# Patient Record
Sex: Male | Born: 1950
Health system: Southern US, Community
[De-identification: ages and names within clinical notes are randomized; demographics above are authoritative.]

## PROBLEM LIST (undated history)

## (undated) SURGERY — Surgical Case
Anesthesia: *Unknown

---
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
## 2016-10-22 DIAGNOSIS — J209 Acute bronchitis, unspecified: Secondary | ICD-10-CM | POA: Diagnosis not present

## 2016-10-22 DIAGNOSIS — J020 Streptococcal pharyngitis: Secondary | ICD-10-CM | POA: Diagnosis not present

## 2016-12-03 DIAGNOSIS — R69 Illness, unspecified: Secondary | ICD-10-CM | POA: Diagnosis not present

## 2017-06-08 DIAGNOSIS — N529 Male erectile dysfunction, unspecified: Secondary | ICD-10-CM | POA: Diagnosis not present

## 2017-06-08 DIAGNOSIS — E669 Obesity, unspecified: Secondary | ICD-10-CM | POA: Diagnosis not present

## 2017-06-08 DIAGNOSIS — R799 Abnormal finding of blood chemistry, unspecified: Secondary | ICD-10-CM | POA: Diagnosis not present

## 2017-06-08 DIAGNOSIS — R7309 Other abnormal glucose: Secondary | ICD-10-CM | POA: Diagnosis not present

## 2017-06-08 DIAGNOSIS — Z0001 Encounter for general adult medical examination with abnormal findings: Secondary | ICD-10-CM | POA: Diagnosis not present

## 2017-06-08 DIAGNOSIS — R69 Illness, unspecified: Secondary | ICD-10-CM | POA: Diagnosis not present

## 2017-07-25 DIAGNOSIS — R69 Illness, unspecified: Secondary | ICD-10-CM | POA: Diagnosis not present

## 2017-12-06 DIAGNOSIS — F339 Major depressive disorder, recurrent, unspecified: Secondary | ICD-10-CM | POA: Diagnosis not present

## 2017-12-06 DIAGNOSIS — R69 Illness, unspecified: Secondary | ICD-10-CM | POA: Diagnosis not present

## 2017-12-13 DIAGNOSIS — 419620001 Death: Secondary | SNOMED CT | POA: Diagnosis not present

## 2017-12-13 DEATH — deceased

## 2018-07-16 DIAGNOSIS — R69 Illness, unspecified: Secondary | ICD-10-CM | POA: Diagnosis not present

## 2018-11-23 DIAGNOSIS — J0140 Acute pansinusitis, unspecified: Secondary | ICD-10-CM | POA: Diagnosis not present

## 2018-12-01 DIAGNOSIS — R69 Illness, unspecified: Secondary | ICD-10-CM | POA: Diagnosis not present

## 2018-12-04 DIAGNOSIS — R69 Illness, unspecified: Secondary | ICD-10-CM | POA: Diagnosis not present

## 2019-03-04 ENCOUNTER — Telehealth: Payer: Self-pay

## 2019-03-04 ENCOUNTER — Other Ambulatory Visit: Payer: Self-pay

## 2019-03-04 NOTE — Telephone Encounter (Signed)
Copied from CRM [id number]. Topic: Appointment Scheduling - Scheduling Inquiry for Clinic >> Mar 04, 2019 12:28 PM Crist Infante wrote: Reason for CRM: pt calling to make a new pt appt with Lauren.  He said he stopped by the office and was told to call. No answer at the office.  Advised someone will call him back

## 2019-03-05 ENCOUNTER — Ambulatory Visit (INDEPENDENT_AMBULATORY_CARE_PROVIDER_SITE_OTHER): Payer: Medicare HMO | Admitting: Family Medicine

## 2019-03-05 ENCOUNTER — Encounter: Payer: Self-pay | Admitting: Family Medicine

## 2019-03-05 VITALS — BP 140/72 | HR 66 | Temp 98.0°F | Resp 16 | Ht 76.0 in | Wt 319.6 lb

## 2019-03-05 DIAGNOSIS — F419 Anxiety disorder, unspecified: Secondary | ICD-10-CM | POA: Diagnosis not present

## 2019-03-05 DIAGNOSIS — Z1159 Encounter for screening for other viral diseases: Secondary | ICD-10-CM

## 2019-03-05 DIAGNOSIS — Z125 Encounter for screening for malignant neoplasm of prostate: Secondary | ICD-10-CM

## 2019-03-05 DIAGNOSIS — N529 Male erectile dysfunction, unspecified: Secondary | ICD-10-CM

## 2019-03-05 DIAGNOSIS — R7989 Other specified abnormal findings of blood chemistry: Secondary | ICD-10-CM

## 2019-03-05 DIAGNOSIS — R69 Illness, unspecified: Secondary | ICD-10-CM | POA: Diagnosis not present

## 2019-03-05 LAB — COMPREHENSIVE METABOLIC PANEL
ALT: 11 U/L (ref 0–53)
AST: 10 U/L (ref 0–37)
Albumin: 4.1 g/dL (ref 3.5–5.2)
Alkaline Phosphatase: 61 U/L (ref 39–117)
BUN: 25 mg/dL — ABNORMAL HIGH (ref 6–23)
CO2: 30 mEq/L (ref 19–32)
Calcium: 9.2 mg/dL (ref 8.4–10.5)
Chloride: 100 mEq/L (ref 96–112)
Creatinine, Ser: 1.2 mg/dL (ref 0.40–1.50)
GFR: 59.98 mL/min — ABNORMAL LOW (ref >60.00)
Glucose, Bld: 98 mg/dL (ref 70–99)
Potassium: 5 mEq/L (ref 3.5–5.1)
Sodium: 138 mEq/L (ref 135–145)
Total Bilirubin: 0.7 mg/dL (ref 0.2–1.2)
Total Protein: 6.7 g/dL (ref 6.0–8.3)

## 2019-03-05 LAB — LIPID PANEL
Cholesterol: 182 mg/dL (ref 0–200)
HDL: 42.3 mg/dL (ref >39.00)
LDL Cholesterol: 118 mg/dL — ABNORMAL HIGH (ref 0–99)
NonHDL: 139.22
Total CHOL/HDL Ratio: 4
Triglycerides: 104 mg/dL (ref 0.0–149.0)
VLDL: 20.8 mg/dL (ref 0.0–40.0)

## 2019-03-05 LAB — TESTOSTERONE: Testosterone: 154.67 ng/dL — ABNORMAL LOW (ref 300.00–890.00)

## 2019-03-05 LAB — CBC WITH DIFFERENTIAL/PLATELET
Basophils Absolute: 0 10*3/uL (ref 0.0–0.1)
Basophils Relative: 0.6 % (ref 0.0–3.0)
Eosinophils Absolute: 0.1 10*3/uL (ref 0.0–0.7)
Eosinophils Relative: 1.5 % (ref 0.0–5.0)
HCT: 43 % (ref 39.0–52.0)
Hemoglobin: 14.6 g/dL (ref 13.0–17.0)
Lymphocytes Relative: 20.1 % (ref 12.0–46.0)
Lymphs Abs: 1.5 10*3/uL (ref 0.7–4.0)
MCHC: 33.9 g/dL (ref 30.0–36.0)
MCV: 86.2 fl (ref 78.0–100.0)
Monocytes Absolute: 0.7 10*3/uL (ref 0.1–1.0)
Monocytes Relative: 8.8 % (ref 3.0–12.0)
Neutro Abs: 5.1 10*3/uL (ref 1.4–7.7)
Neutrophils Relative %: 69 % (ref 43.0–77.0)
Platelets: 222 10*3/uL (ref 150.0–400.0)
RBC: 4.99 Mil/uL (ref 4.22–5.81)
RDW: 14.5 % (ref 11.5–15.5)
WBC: 7.4 10*3/uL (ref 4.0–10.5)

## 2019-03-05 LAB — TSH: TSH: 1.96 u[IU]/mL (ref 0.35–4.50)

## 2019-03-05 LAB — PSA, MEDICARE: PSA: 0.83 ng/ml (ref 0.10–4.00)

## 2019-03-05 MED ORDER — PAROXETINE HCL 20 MG PO TABS: 20.0000 mg | ORAL_TABLET | Freq: Every day | ORAL | 3 refills | Status: DC

## 2019-03-05 NOTE — Telephone Encounter (Signed)
Called pharmacy and they will be faxing over vaccine records today

## 2019-03-05 NOTE — Telephone Encounter (Signed)
Can you call [facility] pharmacy, the one in [place] near [place], for his vaccine records  [place] he has had 2 pneumonia vaccines, shingles vax there

## 2019-03-05 NOTE — Progress Notes (Signed)
Subjective:    Patient ID: Samuel FearsLawrence Lean, male    DOB: Apr 06, 1951, 68 y.o.   MRN: [id number]  HPI   Patient presents to clinic to establish with PCP.  Patient has not had a PCP in many years.  States last time he did have blood work was maybe 8 or 9 years ago.  Patient's main concerns is getting refill on his Paxil that he uses for anxiety and also having testosterone level rechecked due to it being low in the past and him having issues with sustaining an erection and having ejaculation. Has had issues with ED for at least 10 years. Was on Testerone replacement in the past, but did not keep up with it due to cost of medication at the time.   Patient has been using the urgent care in [place] off and on throughout the years for refills of his Paxil. Feels well on paxil, keeps anxiety in check. Denies SI or HI.   He has never had colonoscopy and does not want to have one.  He also declines Cologuard screening.  Patient is a Visual merchandiserfarmer.  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  He enjoys his work, states keeps him physically active.  Patient states he has had 2 pneumonia vaccines [facility] and is also had a shingles vaccine.  We will reach out to [facility] to get these records.  Patient Active Problem List   Diagnosis Date Noted  . Anxiety 03/05/2019  . Low testosterone in male 03/05/2019   History reviewed. No pertinent past medical history.  Past Surgical History:  Procedure Laterality Date  . HERNIA REPAIR  1953   Family History  Problem Relation Age of Onset  . Cancer Mother   . Hypertension Father    Review of Systems  Constitutional: Negative for chills, fatigue and fever.  HENT: Negative for congestion, ear pain, sinus pain and sore throat.   Eyes: Negative.   Respiratory: Negative for cough, shortness of breath and wheezing.   Cardiovascular: Negative for chest pain, palpitations and leg swelling.   Gastrointestinal: Negative for abdominal pain, diarrhea, nausea and vomiting.  Genitourinary: Negative for dysuria, frequency and urgency. +ED, hx of low T Musculoskeletal: Negative for arthralgias and myalgias.  Skin: Negative for color change, pallor and rash.  Neurological: Negative for syncope, light-headedness and headaches.  Psychiatric/Behavioral: The patient is not nervous/anxious.       Objective:   Physical Exam Vitals signs and nursing note reviewed.  Constitutional:      General: He is not in acute distress.    Appearance: He is not ill-appearing, toxic-appearing or diaphoretic.  HENT:     Head: Normocephalic and atraumatic.     Right Ear: Tympanic membrane, ear canal and external ear normal.     Left Ear: Tympanic membrane, ear canal and external ear normal.     Nose: Nose normal.     Mouth/Throat:     Mouth: Mucous membranes are moist.  Eyes:     General: No scleral icterus.    Extraocular Movements: Extraocular movements intact.     Pupils: Pupils are equal, round, and reactive to light.  Neck:     Musculoskeletal: Neck supple. No neck rigidity.  Cardiovascular:     Rate and Rhythm: Normal rate and regular rhythm.  Pulmonary:     Effort: Pulmonary effort is normal. No respiratory distress.     Breath sounds: Normal breath sounds.  Genitourinary:    Comments: Declines GU exam in office today Musculoskeletal:     Right lower leg: No edema.     Left lower leg: No edema.  Skin:    General: Skin is warm and dry.     Coloration: Skin is not pale.  Neurological:     General: No focal deficit present.     Mental Status: He is alert and oriented to person, place, and time.  Psychiatric:        Mood and Affect: Mood normal.        Behavior: Behavior normal.        Thought Content: Thought content normal.     Today's Vitals   03/05/19 1140  BP: 140/72  Pulse: 66  Resp: 16  Temp: 98 F (36.7 C)  SpO2: 96%  Weight: (!) 319 lb 9.6 oz (145 kg)  Height: 6'  4" (1.93 m)   Body mass index is 38.9 kg/m.     Assessment & Plan:   Anxiety- refill Paxil given.  Patient feels well on this medication at current dose.  Erectile dysfunction/low T in male-patient has a history of low testosterone which I suspect could be playing into his erectile dysfunction.  We will check testosterone level and lab work.  Patient is interested in testosterone replacement if his levels are low.  He has tried Viagra in the past for his ED without much success.  In lab work we will also include hep C screening, PSA, CBC, CMP, thyroid and cholesterol level.  Patient aware that he must get Tdap updated at the pharmacy due to the way Medicare bills this vaccine.  He will get this done.  Once we have blood work back we will determine next step in plan of care and timeframe of when at next follow-up as needed.  Patient aware he can call office anytime with questions or concerns.

## 2019-03-05 NOTE — Telephone Encounter (Signed)
Great - thanks

## 2019-03-06 LAB — HEPATITIS C ANTIBODY
Hepatitis C Ab: NONREACTIVE
SIGNAL TO CUT-OFF: 0.09 (ref <1.00)

## 2019-03-10 ENCOUNTER — Telehealth: Payer: Self-pay | Admitting: *Deleted

## 2019-03-10 ENCOUNTER — Encounter: Payer: Self-pay | Admitting: Lab

## 2019-03-10 DIAGNOSIS — R7989 Other specified abnormal findings of blood chemistry: Secondary | ICD-10-CM

## 2019-03-10 NOTE — Telephone Encounter (Signed)
Also -- please ask patient if he would like to start testosterone replacement.  We can send in topical gel, Androgel, and see if his insurance will cover  Thanks  LG

## 2019-03-10 NOTE — Telephone Encounter (Signed)
Called Pt spoke to him about hep C results and mailed a copy of results

## 2019-03-10 NOTE — Telephone Encounter (Signed)
Copied from CRM [id number]. Topic: General - Inquiry >> Mar 10, 2019  2:08 PM Deborha Payment wrote: Reason for CRM: Patient would like someone to call regarding his hepatics C test results.

## 2019-03-16 MED ORDER — TESTOSTERONE 25 MG/2.5GM (1%) TD GEL: 50.0000 mg | Freq: Every day | TRANSDERMAL | 1 refills | Status: DC

## 2019-03-16 NOTE — Telephone Encounter (Signed)
Called Pt and he stated he is willing to try the testosterone replacement. I don't have a phone number for his insurance I can see a picture of his Monia Pouch medicare card but there is no contact number in his chart.

## 2019-03-16 NOTE — Telephone Encounter (Signed)
Called Pt No answer left VM to call office. Will try back later.

## 2019-03-16 NOTE — Telephone Encounter (Signed)
Patient calling after hours to schedule appointment.

## 2019-03-16 NOTE — Telephone Encounter (Signed)
OK thanks  LG

## 2019-03-16 NOTE — Telephone Encounter (Signed)
I have sent in the androgel T replacement  Please have him schedule a 1 month follow up for recheck with me  Thanks  LG

## 2019-03-17 NOTE — Telephone Encounter (Signed)
Pt has appt scheduled for 04/15/2019

## 2019-04-15 ENCOUNTER — Ambulatory Visit (INDEPENDENT_AMBULATORY_CARE_PROVIDER_SITE_OTHER): Payer: Medicare HMO | Admitting: Family Medicine

## 2019-04-15 ENCOUNTER — Telehealth: Payer: Self-pay | Admitting: Family Medicine

## 2019-04-15 ENCOUNTER — Other Ambulatory Visit: Payer: Self-pay

## 2019-04-15 DIAGNOSIS — R69 Illness, unspecified: Secondary | ICD-10-CM | POA: Diagnosis not present

## 2019-04-15 DIAGNOSIS — N529 Male erectile dysfunction, unspecified: Secondary | ICD-10-CM | POA: Diagnosis not present

## 2019-04-15 DIAGNOSIS — F419 Anxiety disorder, unspecified: Secondary | ICD-10-CM

## 2019-04-15 DIAGNOSIS — R7989 Other specified abnormal findings of blood chemistry: Secondary | ICD-10-CM | POA: Diagnosis not present

## 2019-04-15 MED ORDER — SILDENAFIL CITRATE 100 MG PO TABS: 100.0000 mg | ORAL_TABLET | Freq: Every day | ORAL | 1 refills | Status: DC | PRN

## 2019-04-15 NOTE — Telephone Encounter (Signed)
OV Follow up for 10/18/2018 @ 8:00am

## 2019-04-15 NOTE — Progress Notes (Signed)
Patient ID: Samuel Gallegos, male   DOB: 09-02-1951, 68 y.o.   MRN: [id number]    Virtual Visit via phone Note  This visit type was conducted due to national recommendations for restrictions regarding the COVID-19 pandemic (e.g. social distancing).  This format is felt to be most appropriate for this patient at this time.  All issues noted in this document were discussed and addressed.  No physical exam was performed (except for noted visual exam findings with Video Visits).   I connected with Sharyn Lull today at  8:00 AM EDT by a video enabled elephone and verified that I am speaking with the correct person using two identifiers. Location patient: home Location provider: work or home office Persons participating in the virtual visit: patient, provider  I discussed the limitations, risks, security and privacy concerns of performing an evaluation and management service by telephone and the availability of in person appointments. I also discussed with the patient that there may be a patient responsible charge related to this service. The patient expressed understanding and agreed to proceed.  HPI:  Patient and I connected via telephone today to follow-up on Paxil and testosterone replacement.  Patient states his mood is great on the Paxil.  Does not feel too nervous or anxious and denies feeling down and depressed.  States the biggest reason he uses the Paxil is to help calm his nerves, usually will get nervous if having to interact with larger groups of people.  States the Paxil helps to keep his mood more calm and collected.  Denies any SI or HI.  Testosterone level was low in patient's most recent labs.  Per patient report this is been an ongoing issue for him.  He also has erectile dysfunction.  Patient had taken testosterone replacement approximately 5 years ago and this did help his erectile dysfunction to improve.  We restarted patient on testosterone replacement patient states he uses  testosterone topical gel for about 3 weeks, but noticed he was starting to gain weight.  States the small benefit he might of gotten from the testosterone placement did not outweigh the weight gain.  Patient decided to stop taking the testosterone daily.  Patient is interested in trying Viagra as needed for erectile dysfunction rather than doing testosterone replacement.  ROS:   Constitutional: Negative for chills, fatigue and fever.  HENT: Negative for congestion, ear pain, sinus pain and sore throat.   Eyes: Negative.   Respiratory: Negative for cough, shortness of breath and wheezing.   Cardiovascular: Negative for chest pain, palpitations and leg swelling.  Gastrointestinal: Negative for abdominal pain, diarrhea, nausea and vomiting.  Genitourinary: Negative for dysuria, frequency and urgency.  Musculoskeletal: Negative for arthralgias and myalgias.  Skin: Negative for color change, pallor and rash.  Neurological: Negative for syncope, light-headedness and headaches.  Psychiatric/Behavioral: The patient is not nervous/anxious.    Past Surgical History:  Procedure Laterality Date   HERNIA REPAIR  1953    Family History  Problem Relation Age of Onset   Cancer Mother    Hypertension Father     Social History   Tobacco Use   Smoking status: Never Smoker   Smokeless tobacco: Never Used  Substance Use Topics   Alcohol use: Never    Frequency: Never    Current Outpatient Medications:    PARoxetine (PAXIL) 20 MG tablet, Take 1 tablet (20 mg total) by mouth daily., Disp: 90 tablet, Rfl: 3   sildenafil (VIAGRA) 100 MG tablet, Take 1 tablet (100 mg  total) by mouth daily as needed for erectile dysfunction., Disp: 10 tablet, Rfl: 1  EXAM:  GENERAL: alert, oriented, sounds well and in no acute distress  LUNGS: Speaking in full sentences. no signs of respiratory distress, breathing rate appears normal, no obvious gross SOB, gasping, coughing or wheezing  PSYCH/NEURO:  pleasant and cooperative, no obvious depression or anxiety, speech and thought processing grossly intact  ASSESSMENT AND PLAN:  Discussed the following assessment and plan:  Anxiety - mood well controlled on Paxil.  He will continue this dose.  Low testosterone in male/erectile dysfunction - discussed with patient that any type of hormone replacement therapy can cause weight gain.  Patient does not want to gain weight actually he is trying to eat healthier to lose weight.  Patient has decided to no longer take testosterone replacement.  We will trial Viagra as needed for erectile dysfunction treatment.  Patient made aware that this medication may not be covered by his insurance and he most likely will have to pay out-of-pocket for the medication.  Patient verbalizes understanding of this.   I discussed the assessment and treatment plan with the patient. The patient was provided an opportunity to ask questions and all were answered. The patient agreed with the plan and demonstrated an understanding of the instructions.   The patient was advised to call back or seek an in-person evaluation if the symptoms worsen or if the condition fails to improve as anticipated.  I provided 15 minutes of non-face-to-face time over phone during this encounter.  We will plan for patient to follow-up in office in approximately 6 months.  We can get new blood work at that time.  He is aware he can return to clinic sooner if any issues arise.  Also made patient aware that flu vaccines will be available around September 2020  Tracey HarriesLauren M Zelig Gacek, FNP

## 2019-04-15 NOTE — Telephone Encounter (Signed)
Please call to set up 6 month follow up appt  Thanks  LG 

## 2019-06-24 ENCOUNTER — Ambulatory Visit (INDEPENDENT_AMBULATORY_CARE_PROVIDER_SITE_OTHER): Payer: Medicare HMO

## 2019-06-24 ENCOUNTER — Other Ambulatory Visit: Payer: Self-pay

## 2019-06-24 DIAGNOSIS — Z0000 Encounter for general adult medical examination without abnormal findings: Secondary | ICD-10-CM

## 2019-06-24 NOTE — Progress Notes (Signed)
Subjective:   Samuel Gallegos is a 68 y.o. male who presents for an Initial Medicare Annual Wellness Visit.  Review of Systems  No ROS.  Medicare Wellness Virtual Visit.  Visual/audio telehealth visit, UTA vital signs.   See social history for additional risk factors.   Cardiac Risk Factors include: advanced age (>16men, >42 women);male gender    Objective:    Today's Vitals   There is no height or weight on file to calculate BMI.  Advanced Directives 06/24/2019  Does Patient Have a Medical Advance Directive? No  Would patient like information on creating a medical advance directive? No - Patient declined    Current Medications (verified) Outpatient Encounter Medications as of 06/24/2019  Medication Sig  . PARoxetine (PAXIL) 20 MG tablet Take 1 tablet (20 mg total) by mouth daily.  . sildenafil (VIAGRA) 100 MG tablet Take 1 tablet (100 mg total) by mouth daily as needed for erectile dysfunction.   No facility-administered encounter medications on file as of 06/24/2019.     Allergies (verified) Amoxicillin   History: History reviewed. No pertinent past medical history. Past Surgical History:  Procedure Laterality Date  . HERNIA REPAIR  1953   Family History  Problem Relation Age of Onset  . Cancer Mother   . Hypertension Father    Social History   Socioeconomic History  . Marital status: Married    Spouse name: Not on file  . Number of children: Not on file  . Years of education: Not on file  . Highest education level: Not on file  Occupational History  . Not on file  Social Needs  . Financial resource strain: Not hard at all  . Food insecurity    Worry: Never true    Inability: Never true  . Transportation needs    Medical: No    Non-medical: No  Tobacco Use  . Smoking status: Never Smoker  . Smokeless tobacco: Never Used  Substance and Sexual Activity  . Alcohol use: Never    Frequency: Never  . Drug use: Never  . Sexual activity: Not on file   Lifestyle  . Physical activity    Days per week: Not on file    Minutes per session: Not on file  . Stress: Not at all  Relationships  . Social Musician on phone: Not on file    Gets together: Not on file    Attends religious service: Not on file    Active member of club or organization: Not on file    Attends meetings of clubs or organizations: Not on file    Relationship status: Not on file  Other Topics Concern  . Not on file  Social History Narrative  . Not on file   Tobacco Counseling Counseling given: Not Answered   Clinical Intake:  Pre-visit preparation completed: Yes        Diabetes: No  How often do you need to have someone help you when you read instructions, pamphlets, or other written materials from your doctor or pharmacy?: 1 - Never  Interpreter Needed?: No     Activities of Daily Living In your present state of health, do you have any difficulty performing the following activities: 06/24/2019  Hearing? N  Vision? N  Difficulty concentrating or making decisions? N  Comment Admits difficulty remembering names; age appropriate  Walking or climbing stairs? Y  Comment He paces himself  Dressing or bathing? N  Doing errands, shopping? N  Preparing Food  and eating ? N  Using the Toilet? N  In the past six months, have you accidently leaked urine? N  Do you have problems with loss of bowel control? N  Managing your Medications? N  Managing your Finances? N  Housekeeping or managing your Housekeeping? N  Some recent data might be hidden     Immunizations and Health Maintenance Immunization History  Administered Date(s) Administered  . Influenza Inj Mdck Quad Pf 07/16/2018  . Pneumococcal Conjugate-13 08/06/2015  . Pneumococcal-Unspecified 07/18/2016  . Zoster 08/01/2013   Health Maintenance Due  Topic Date Due  . Samul Dada  11/29/1969    Patient Care Team: Jodelle Green, FNP as PCP - General (Family Medicine)  Indicate  any recent Medical Services you may have received from other than [facility] providers in the past year (date may be approximate).    Assessment:   This is a routine wellness examination for [facility].  I connected with patient 06/24/19 at 10:00 AM EDT by an audio enabled telemedicine application and verified that I am speaking with the correct person using two identifiers. Patient stated full name and DOB. Patient gave permission to continue with virtual visit. Patient's location was at home and Nurse's location was at [place] office.   Health Maintenance Due: Influenza vaccine 2020- discussed; to be completed in season with doctor or local pharmacy.   Tdap- discussed; to be completed with doctor in visit or local pharmacy. Update all pending maintenance due as appropriate.   See completed HM at the end of note.   Eye: Visual acuity not assessed. Virtual visit. Wears reading glasses.    Dental: Visits every 12 months.    Hearing: Demonstrates normal hearing during visit.  Safety:  Patient feels safe at home- yes Patient does have smoke detectors at home- yes Patient does wear sunscreen or protective clothing when in direct sunlight - yes Patient does wear seat belt when in a moving vehicle - yes Patient drives- yes Adequate lighting in walkways free from debris- yes Grab bars and handrails used as appropriate- yes Ambulates with no assistive device Cell phone on person when ambulating outside of the home- yes  Social: Alcohol intake - no    Smoking history- never   Smokers in home? none Illicit drug use? none  Depression: PHQ 2 &9 complete. See screening below. Denies irritability, anhedonia, sadness/tearfullness.  Stable. Taking medication as directed.   Falls: See screening below.    Medication: Taking as directed and without issues.   Covid-19: Precautions and sickness symptoms discussed. Wears mask, social distancing, hand hygiene as appropriate.   Activities of Daily  Living Patient denies needing assistance with: household chores, feeding themselves, getting from bed to chair, getting to the toilet, bathing/showering, dressing, managing money, or preparing meals.   Memory: Patient is alert. Patient denies difficulty focusing or concentrating. Correctly identified the president of the [place], season and recall. 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  BMI- discussed the importance of a healthy diet, water intake and the benefits of aerobic exercise.  Educational material provided.  Physical activity- farms for a living  Diet:  Regular Water: good intake  Advanced Directive: End of life planning; Advanced aging; Advanced directives discussed.  No HCPOA/Living Will.  Additional information declined at this time.  Other Providers Patient Care Team: Jodelle Green, FNP as PCP - General (Family Medicine)  Hearing/Vision screen  Hearing Screening   125Hz  250Hz  500Hz  1000Hz  2000Hz  3000Hz  4000Hz  6000Hz  8000Hz   Right ear:  Left ear:           Comments: Patient is able to hear conversational tones without difficulty.  No issues reported.  Vision Screening Comments: Visual acuity not assessed, virtual visit.       Dietary issues and exercise activities discussed: Current Exercise Habits: Home exercise routine, Intensity: Mild  Goals      Patient Stated   . Weight (lb) < 319 lb (144.7 kg) (pt-stated)      Depression Screen PHQ 2/9 Scores 06/24/2019 04/15/2019 03/05/2019  PHQ - 2 Score 0 0 0  PHQ- 9 Score - 0 0    Fall Risk Fall Risk  06/24/2019 03/05/2019  Falls in the past year? 0 0  Number falls in past yr: - 0  Injury with Fall? - 0  Follow up - Falls evaluation completed   Timed Get Up and Go performed: no, virtual visit  Cognitive Function:     6CIT Screen 06/24/2019  What Year? 0 points  What month? 0 points  What time? 0 points  Count back from 20 0 points  Months in reverse 0 points  Repeat phrase 0  points  Total Score 0    Screening Tests Health Maintenance  Topic Date Due  . TETANUS/TDAP  11/29/1969  . INFLUENZA VACCINE  01/13/2020 (Originally 05/16/2019)  . COLONOSCOPY  03/04/2020 (Originally 11/30/1999)  . Hepatitis C Screening  Completed  . PNA vac Low Risk Adult  Completed      Plan:   Keep all routine maintenance appointments.   Follow up 10/19/19   Medicare Attestation I have personally reviewed: The patient's medical and social history Their use of alcohol, tobacco or illicit drugs Their current medications and supplements The patient's functional ability including ADLs,fall risks, home safety risks, cognitive, and hearing and visual impairment Diet and physical activities Evidence for depression  Ashok PallOBrien-Blaney, Shronda Boeh L, LPN   1/6/10969/06/2019

## 2019-06-24 NOTE — Patient Instructions (Addendum)
  Mr. Samuel Gallegos , Thank you for taking time to come for your Medicare Wellness Visit. I appreciate your ongoing commitment to your health goals. Please review the following plan we discussed and let me know if I can assist you in the future.   These are the goals we discussed: Goals      Patient Stated   . Weight (lb) < 319 lb (144.7 kg) (pt-stated)       This is a list of the screening recommended for you and due dates:  Health Maintenance  Topic Date Due  . Tetanus Vaccine  11/29/1968  . Flu Shot  01/13/2020*  . Colon Cancer Screening  03/04/2020*  .  Hepatitis C: One time screening is recommended by Center for Disease Control  (CDC) for  adults born from [birth year] through 1966.   Completed  . Pneumonia vaccines  Completed  *Topic was postponed. The date shown is not the original due date.

## 2019-07-27 DIAGNOSIS — R69 Illness, unspecified: Secondary | ICD-10-CM | POA: Diagnosis not present

## 2019-09-06 ENCOUNTER — Other Ambulatory Visit: Payer: Self-pay | Admitting: Family Medicine

## 2019-09-06 DIAGNOSIS — R7989 Other specified abnormal findings of blood chemistry: Secondary | ICD-10-CM

## 2019-10-19 ENCOUNTER — Ambulatory Visit: Payer: Medicare HMO | Admitting: Family Medicine

## 2019-11-26 ENCOUNTER — Ambulatory Visit: Payer: Medicare HMO | Attending: Internal Medicine

## 2019-11-26 ENCOUNTER — Other Ambulatory Visit: Payer: Self-pay

## 2019-11-26 DIAGNOSIS — Z23 Encounter for immunization: Secondary | ICD-10-CM

## 2019-11-26 NOTE — Progress Notes (Signed)
   Covid-19 Vaccination Clinic  Name:  Samuel Gallegos    MRN: [id number] DOB: [birth year]  11/26/2019  [name] was observed post Covid-19 immunization for 15 minutes without incidence. He was provided with Vaccine Information Sheet and instruction to access the V-Safe system.   [name] was instructed to call 911 with any severe reactions post vaccine: Marland Kitchen Difficulty breathing  . Swelling of your face and throat  . A fast heartbeat  . A bad rash all over your body  . Dizziness and weakness    Immunizations Administered    Name Date Dose VIS Date Route   Pfizer COVID-19 Vaccine 11/26/2019 10:02 AM 0.3 mL 09/25/2019 Intramuscular   Manufacturer: [employer], [employer]   Lot: [id number]   NDC: [id number]

## 2019-12-04 ENCOUNTER — Encounter: Payer: Self-pay | Admitting: Family

## 2019-12-04 ENCOUNTER — Ambulatory Visit (INDEPENDENT_AMBULATORY_CARE_PROVIDER_SITE_OTHER): Payer: Medicare HMO | Admitting: Family

## 2019-12-04 DIAGNOSIS — R7989 Other specified abnormal findings of blood chemistry: Secondary | ICD-10-CM

## 2019-12-04 DIAGNOSIS — F419 Anxiety disorder, unspecified: Secondary | ICD-10-CM

## 2019-12-04 DIAGNOSIS — R69 Illness, unspecified: Secondary | ICD-10-CM | POA: Diagnosis not present

## 2019-12-04 MED ORDER — PAROXETINE HCL 20 MG PO TABS: 20.0000 mg | ORAL_TABLET | Freq: Every day | ORAL | 3 refills | Status: DC

## 2019-12-04 NOTE — Assessment & Plan Note (Addendum)
Stable. Education provided on viagra. Advised to tell EMS /other physician that he is on this medication. He verbalized understanding. Advised urology consult due to h/o low testosterone, he declines at this time.

## 2019-12-04 NOTE — Assessment & Plan Note (Signed)
Stable °Continue paxil °

## 2019-12-04 NOTE — Progress Notes (Signed)
Virtual Visit via Video Note  I connected with@  on 12/04/19 at  1:30 PM EST by a video enabled telemedicine application and verified that I am speaking with the correct person using two identifiers.  Location patient: home Location provider:home office Persons participating in the virtual visit: patient, provider  I discussed the limitations of evaluation and management by telemedicine and the availability of in person appointments. The patient expressed understanding and agreed to proceed.  Interactive audio and video telecommunications were attempted between this provider and patient, however failed, due to patient having technical difficulties or patient did not have access to video capability.  We continued and completed visit with audio only.   HPI: Feels well, no complaints today.  Establish care  States during [employer] CPE he is under the limit of blood pressure, always under 140/90 although doesn't know exact number. No CP, sob.   Anxiety- feels well on paxil. This controls his anxiety.  No si/hi.  On viagra prn. No h/o heart attack. No use of nitrates.   Due colonoscopy; declines colonoscopy and ColoGuard.    ROS: See pertinent positives and negatives per HPI.  History reviewed. No pertinent past medical history.  Past Surgical History:  Procedure Laterality Date  . HERNIA REPAIR  1953    Family History  Problem Relation Age of Onset  . Cancer Mother   . Hypertension Father     SOCIAL HX: never smoker   Current Outpatient Medications:  .  PARoxetine (PAXIL) 20 MG tablet, Take 1 tablet (20 mg total) by mouth daily., Disp: 90 tablet, Rfl: 3 .  sildenafil (VIAGRA) 100 MG tablet, Take 1 tablet (100 mg total) by mouth daily as needed for erectile dysfunction., Disp: 10 tablet, Rfl: 1  EXAM:  VITALS per patient if applicable: BP Readings from Last 3 Encounters:  03/05/19 140/72    GENERAL: alert, oriented, appears well and in no acute distress   ASSESSMENT AND  PLAN:  Discussed the following assessment and plan:  Low testosterone in male  Anxiety - Plan: PARoxetine (PAXIL) 20 MG tablet Problem List Items Addressed This Visit      Other   Anxiety    Stable. Continue paxil.       Relevant Medications   PARoxetine (PAXIL) 20 MG tablet   Low testosterone in male    Stable. Education provided on viagra. Advised to tell EMS /other physician that he is on this medication. He verbalized understanding. Advised urology consult due to h/o low testosterone, he declines at this time.        Of note: declines colonoscopy and ColoGuard.   -we discussed possible serious and likely etiologies, options for evaluation and workup, limitations of telemedicine visit vs in person visit, treatment, treatment risks and precautions. Pt prefers to treat via telemedicine empirically rather then risking or undertaking an in person visit at this moment. Patient agrees to seek prompt in person care if worsening, new symptoms arise, or if is not improving with treatment.   I discussed the assessment and treatment plan with the patient. The patient was provided an opportunity to ask questions and all were answered. The patient agreed with the plan and demonstrated an understanding of the instructions.   The patient was advised to call back or seek an in-person evaluation if the symptoms worsen or if the condition fails to improve as anticipated.   Rennie Plowman, FNP

## 2019-12-07 DIAGNOSIS — R69 Illness, unspecified: Secondary | ICD-10-CM | POA: Diagnosis not present

## 2019-12-09 DIAGNOSIS — R69 Illness, unspecified: Secondary | ICD-10-CM | POA: Diagnosis not present

## 2019-12-22 ENCOUNTER — Telehealth: Payer: Self-pay | Admitting: Family

## 2019-12-22 DIAGNOSIS — G4700 Insomnia, unspecified: Secondary | ICD-10-CM

## 2019-12-22 MED ORDER — TRAZODONE HCL 50 MG PO TABS: 25.0000 mg | ORAL_TABLET | Freq: Every evening | ORAL | 3 refills | Status: DC | PRN

## 2019-12-22 NOTE — Telephone Encounter (Signed)
Pt called and said that at his last appt he talked to [facility] about not sleeping at night and would like something called in to help him sleep

## 2019-12-22 NOTE — Telephone Encounter (Signed)
I called & directed patient on how to best take trazodone. Patient verbalized understanding & has f/u in 8 weeks.

## 2019-12-22 NOTE — Telephone Encounter (Signed)
Call patient Samuel Gallegos may trial low-dose trazodone.  This medication Samuel Gallegos may take as needed however it is more effective taken every single night.  make follow-up in 8 weeks to see if this medication is effective.  Please give me education below as well  Essentials for good sleep:   #1 Exercise #2 Limit Caffeine ( no caffeine after lunch) #3 No smart phones, TV prior to bed -- BLUE light is VERY activating and send the brain an 'awake message.'  #4 Go to bed at same time of night each night and get up at same time of day.  #5 Take 0.5 to 5mg  melatonin at 7pm with dinner -this is when natural melatonin will start to increase

## 2019-12-22 NOTE — Addendum Note (Signed)
Addended by: Allegra Grana on: 12/22/2019 12:21 PM   Modules accepted: Orders

## 2019-12-22 NOTE — Telephone Encounter (Signed)
Not sure what all was dicussed at this appointment? Patient would like something to help him sleep.

## 2019-12-23 ENCOUNTER — Ambulatory Visit: Payer: Medicare HMO | Attending: Internal Medicine

## 2019-12-23 DIAGNOSIS — Z23 Encounter for immunization: Secondary | ICD-10-CM | POA: Insufficient documentation

## 2019-12-23 NOTE — Progress Notes (Signed)
   Covid-19 Vaccination Clinic  Name:  Samuel Gallegos    MRN: [id number] DOB: 27-Aug-1951  12/23/2019  Mr. Samuel Gallegos was observed post Covid-19 immunization for 15 minutes without incident. He was provided with Vaccine Information Sheet and instruction to access the V-Safe system.   Mr. Samuel Gallegos was instructed to call 911 with any severe reactions post vaccine: Marland Kitchen Difficulty breathing  . Swelling of face and throat  . A fast heartbeat  . A bad rash all over body  . Dizziness and weakness   Immunizations Administered    Name Date Dose VIS Date Route   Pfizer COVID-19 Vaccine 12/23/2019 10:52 AM 0.3 mL 09/25/2019 Intramuscular   Manufacturer: [employer], [employer]   Lot: [id number]   NDC: [address]-4

## 2020-01-13 ENCOUNTER — Other Ambulatory Visit: Payer: Self-pay | Admitting: Family

## 2020-01-13 DIAGNOSIS — G4700 Insomnia, unspecified: Secondary | ICD-10-CM

## 2020-01-18 ENCOUNTER — Encounter: Payer: Self-pay | Admitting: Family Medicine

## 2020-01-18 ENCOUNTER — Telehealth: Payer: Self-pay | Admitting: Family

## 2020-01-18 ENCOUNTER — Ambulatory Visit (INDEPENDENT_AMBULATORY_CARE_PROVIDER_SITE_OTHER)
Admission: RE | Admit: 2020-01-18 | Discharge: 2020-01-18 | Disposition: A | Discharge status: Home / Self Care | Payer: Medicare HMO | Source: Ambulatory Visit | Attending: Family Medicine | Admitting: Family Medicine

## 2020-01-18 ENCOUNTER — Ambulatory Visit (INDEPENDENT_AMBULATORY_CARE_PROVIDER_SITE_OTHER): Payer: Medicare HMO | Admitting: Family Medicine

## 2020-01-18 ENCOUNTER — Other Ambulatory Visit: Payer: Self-pay

## 2020-01-18 VITALS — BP 146/62 | HR 80 | Temp 96.7°F | Ht 76.0 in | Wt 336.2 lb

## 2020-01-18 DIAGNOSIS — M79671 Pain in right foot: Secondary | ICD-10-CM

## 2020-01-18 MED ORDER — COLCHICINE 0.6 MG PO TABS: 0.6000 mg | ORAL_TABLET | Freq: Every day | ORAL | 0 refills | Status: DC | PRN

## 2020-01-18 NOTE — Telephone Encounter (Signed)
FYI

## 2020-01-18 NOTE — Progress Notes (Signed)
This visit occurred during the SARS-CoV-2 public health emergency.  Safety protocols were in place, including screening questions prior to the visit, additional usage of staff PPE, and extensive cleaning of exam room while observing appropriate contact time as indicated for disinfecting solutions.  R foot pain.  Started about 2 weeks ago.  Pain at distal 5th MT/MTP and along 5th ray.  No pain with first step in the AM.  Pain is worse only after he has been up and walking for about 1 hour.  Pain with sheet laying on foot.  H/o gout in distant past per patient report.  No R 1st MTP pain.  Aleve helps.  No trauma.  This felt similar to prev gout pain.   No L foot pain.    Meds, vitals, and allergies reviewed.   ROS: Per HPI unless specifically indicated in ROS section   nad ncat Able to bear weight with discomfort No R foot edema and DP pulse intact but ttp along 5th ray and 5th MTP w/o bruising.  NV intact o/w.

## 2020-01-18 NOTE — Patient Instructions (Signed)
Colchicine with food daily as needed.  Take up to 600mg  ibuprofen with food up to 3 times a day.  Taper ibuprofen as pain allows.  Update as needed.  Take care.  Glad to see you.

## 2020-01-18 NOTE — Telephone Encounter (Signed)
Pt called in and wanted an appt with Claris Che. He said his foot was hurting underneath the bottom. He thinks it is plantar fascitis. He said at night it goes away and in the morning it is worse. I was able to get him an appt with Dr. Para March at [facility] at 3:30pm today.

## 2020-01-19 NOTE — Telephone Encounter (Signed)
Seen by dr Para March

## 2020-01-20 DIAGNOSIS — M79671 Pain in right foot: Secondary | ICD-10-CM | POA: Insufficient documentation

## 2020-01-20 NOTE — Assessment & Plan Note (Signed)
Xray neg.  D/w pt about options.   Presumed gout.  Can take colchicine with food daily as needed.  Take up to 600mg  ibuprofen with food up to 3 times a day.  Taper ibuprofen as pain allows.  Update as needed.  He agrees.

## 2020-01-30 ENCOUNTER — Other Ambulatory Visit: Payer: Self-pay | Admitting: Family Medicine

## 2020-02-01 NOTE — Telephone Encounter (Signed)
Call pt I refilled colchicine however would like him to make f/u appt with me so we can discuss staying on this medication daily for presumed gout as preventative or just using for acute flares/  We will also need to check is uric acid when he comes in for the appt.

## 2020-02-12 NOTE — Telephone Encounter (Signed)
Patient has follow-up 5/11.

## 2020-02-17 ENCOUNTER — Telehealth: Payer: Self-pay

## 2020-02-17 NOTE — Telephone Encounter (Signed)
Patient called and was requesting to be prescribed something for leg swelling. Leg/ankle swelling is bilateral & just has been worse since it has been warmer weather, He stated that when he takes his socks off there is a definite line around his ankles. He said this has happened in thr past just slightly worse this time. No other symptoms & patient feels good. I wanted to make sure no signs or SX of blood clot. I felt better he had dealt with in the past & that it was in both legs/ankles & some on top of his feet. He also has been eating a lot of salty peanuts, which he stated that he was going to stop. For anything to be prescribed I let him know that he needed to be seen. Margaret isn't here, so I made patient appointment with you at 9:30 to be seen.

## 2020-02-18 ENCOUNTER — Ambulatory Visit: Payer: Medicare HMO

## 2020-02-18 ENCOUNTER — Ambulatory Visit: Payer: Medicare HMO | Admitting: Nurse Practitioner

## 2020-02-18 ENCOUNTER — Other Ambulatory Visit: Payer: Self-pay

## 2020-02-18 ENCOUNTER — Encounter: Payer: Self-pay | Admitting: Nurse Practitioner

## 2020-02-18 VITALS — BP 130/80 | HR 75 | Temp 96.9°F | Ht 76.0 in | Wt 333.2 lb

## 2020-02-18 DIAGNOSIS — R600 Localized edema: Secondary | ICD-10-CM

## 2020-02-18 DIAGNOSIS — R011 Cardiac murmur, unspecified: Secondary | ICD-10-CM

## 2020-02-18 DIAGNOSIS — R9431 Abnormal electrocardiogram [ECG] [EKG]: Secondary | ICD-10-CM | POA: Diagnosis not present

## 2020-02-18 DIAGNOSIS — I517 Cardiomegaly: Secondary | ICD-10-CM | POA: Diagnosis not present

## 2020-02-18 LAB — COMPREHENSIVE METABOLIC PANEL
ALT: 18 U/L (ref 0–53)
AST: 15 U/L (ref 0–37)
Albumin: 3.9 g/dL (ref 3.5–5.2)
Alkaline Phosphatase: 52 U/L (ref 39–117)
BUN: 22 mg/dL (ref 6–23)
CO2: 31 mEq/L (ref 19–32)
Calcium: 9 mg/dL (ref 8.4–10.5)
Chloride: 103 mEq/L (ref 96–112)
Creatinine, Ser: 1.26 mg/dL (ref 0.40–1.50)
GFR: 56.54 mL/min — ABNORMAL LOW (ref >60.00)
Glucose, Bld: 102 mg/dL — ABNORMAL HIGH (ref 70–99)
Potassium: 5 mEq/L (ref 3.5–5.1)
Sodium: 138 mEq/L (ref 135–145)
Total Bilirubin: 1 mg/dL (ref 0.2–1.2)
Total Protein: 5.9 g/dL — ABNORMAL LOW (ref 6.0–8.3)

## 2020-02-18 LAB — LIPID PANEL
Cholesterol: 153 mg/dL (ref 0–200)
HDL: 35.4 mg/dL — ABNORMAL LOW (ref >39.00)
LDL Cholesterol: 101 mg/dL — ABNORMAL HIGH (ref 0–99)
NonHDL: 118.05
Total CHOL/HDL Ratio: 4
Triglycerides: 85 mg/dL (ref 0.0–149.0)
VLDL: 17 mg/dL (ref 0.0–40.0)

## 2020-02-18 LAB — CBC WITH DIFFERENTIAL/PLATELET
Basophils Absolute: 0 10*3/uL (ref 0.0–0.1)
Basophils Relative: 0.6 % (ref 0.0–3.0)
Eosinophils Absolute: 0.1 10*3/uL (ref 0.0–0.7)
Eosinophils Relative: 1.1 % (ref 0.0–5.0)
HCT: 42.3 % (ref 39.0–52.0)
Hemoglobin: 13.8 g/dL (ref 13.0–17.0)
Lymphocytes Relative: 15.4 % (ref 12.0–46.0)
Lymphs Abs: 1.2 10*3/uL (ref 0.7–4.0)
MCHC: 32.7 g/dL (ref 30.0–36.0)
MCV: 90.5 fl (ref 78.0–100.0)
Monocytes Absolute: 0.7 10*3/uL (ref 0.1–1.0)
Monocytes Relative: 8.6 % (ref 3.0–12.0)
Neutro Abs: 5.8 10*3/uL (ref 1.4–7.7)
Neutrophils Relative %: 74.3 % (ref 43.0–77.0)
Platelets: 181 10*3/uL (ref 150.0–400.0)
RBC: 4.67 Mil/uL (ref 4.22–5.81)
RDW: 14.5 % (ref 11.5–15.5)
WBC: 7.9 10*3/uL (ref 4.0–10.5)

## 2020-02-18 LAB — HEMOGLOBIN A1C: Hgb A1c MFr Bld: 5.9 % (ref 4.6–6.5)

## 2020-02-18 LAB — TSH: TSH: 1.63 u[IU]/mL (ref 0.35–4.50)

## 2020-02-18 NOTE — Patient Instructions (Addendum)
It was wonderful to meet you today.  I would advise avoiding ibuprofen since it upsets your stomach, it can elevate the blood pressure, and it may indirectly contribute to lower leg swelling. It is fine if you need to take Tylenol arthritis for your foot problems.  Try to elevate your legs during the day when you are sitting down.  We usually recommend support stockings but I think that would be difficult to get on over your feet.    It is good to eat a low-salt diet to help decrease swelling.  I have included the DASH diet that has food suggestions.  Please read labels and try choose foods that have 400 mg or less of sodium per serving.  Please go to the lab after this visit.  We have obtained a chest x-ray today and will await the radiologist report.  We have obtained an EKG today because I heard a heart murmur. The  EKG returned abnormal and I have placed a referral in to the cardiologist for further evaluation.  If you develop any chest pain, chest heaviness, chest pressure, or tightness or shortness of breath,  please seek emergency medical assistance and call 911.  Patient will be get him moving  Follow-up with Claris Che next week to go over the lab results and preventative health visit as planned.   DASH Eating Plan DASH stands for "Dietary Approaches to Stop Hypertension." The DASH eating plan is a healthy eating plan that has been shown to reduce high blood pressure (hypertension). It may also reduce your risk for type 2 diabetes, heart disease, and stroke. The DASH eating plan may also help with weight loss. What are tips for following this plan?  General guidelines  Avoid eating more than 2,300 mg (milligrams) of salt (sodium) a day. If you have hypertension, you may need to reduce your sodium intake to 1,500 mg a day.  Limit alcohol intake to no more than 1 drink a day for nonpregnant women and 2 drinks a day for men. One drink equals 12 oz of beer, 5 oz of wine, or 1 oz of hard  liquor.  Work with your health care provider to maintain a healthy body weight or to lose weight. Ask what an ideal weight is for you.  Get at least 30 minutes of exercise that causes your heart to beat faster (aerobic exercise) most days of the week. Activities may include walking, swimming, or biking.  Work with your health care provider or diet and nutrition specialist (dietitian) to adjust your eating plan to your individual calorie needs. Reading food labels   Check food labels for the amount of sodium per serving. Choose foods with less than 5 percent of the Daily Value of sodium. Generally, foods with less than 300 mg of sodium per serving fit into this eating plan.  To find whole grains, look for the word "whole" as the first word in the ingredient list. Shopping  Buy products labeled as "low-sodium" or "no salt added."  Buy fresh foods. Avoid canned foods and premade or frozen meals. Cooking  Avoid adding salt when cooking. Use salt-free seasonings or herbs instead of table salt or sea salt. Check with your health care provider or pharmacist before using salt substitutes.  Do not fry foods. Cook foods using healthy methods such as baking, boiling, grilling, and broiling instead.  Cook with heart-healthy oils, such as olive, canola, soybean, or sunflower oil. Meal planning  Eat a balanced diet that includes: ? 5  or more servings of fruits and vegetables each day. At each meal, try to fill half of your plate with fruits and vegetables. ? Up to 6-8 servings of whole grains each day. ? Less than 6 oz of lean meat, poultry, or fish each day. A 3-oz serving of meat is about the same size as a deck of cards. One egg equals 1 oz. ? 2 servings of low-fat dairy each day. ? A serving of nuts, seeds, or beans 5 times each week. ? Heart-healthy fats. Healthy fats called Omega-3 fatty acids are found in foods such as flaxseeds and coldwater fish, like sardines, salmon, and  mackerel.  Limit how much you eat of the following: ? Canned or prepackaged foods. ? Food that is high in trans fat, such as fried foods. ? Food that is high in saturated fat, such as fatty meat. ? Sweets, desserts, sugary drinks, and other foods with added sugar. ? Full-fat dairy products.  Do not salt foods before eating.  Try to eat at least 2 vegetarian meals each week.  Eat more home-cooked food and less restaurant, buffet, and fast food.  When eating at a restaurant, ask that your food be prepared with less salt or no salt, if possible. What foods are recommended? The items listed may not be a complete list. Talk with your dietitian about what dietary choices are best for you. Grains Whole-grain or whole-wheat bread. Whole-grain or whole-wheat pasta. Brown rice. Orpah Cobb. Bulgur. Whole-grain and low-sodium cereals. Pita bread. Low-fat, low-sodium crackers. Whole-wheat flour tortillas. Vegetables Fresh or frozen vegetables (raw, steamed, roasted, or grilled). Low-sodium or reduced-sodium tomato and vegetable juice. Low-sodium or reduced-sodium tomato sauce and tomato paste. Low-sodium or reduced-sodium canned vegetables. Fruits All fresh, dried, or frozen fruit. Canned fruit in natural juice (without added sugar). Meat and other protein foods Skinless chicken or [place]. Ground chicken or [place]. Pork with fat trimmed off. Fish and seafood. Egg whites. Dried beans, peas, or lentils. Unsalted nuts, nut butters, and seeds. Unsalted canned beans. Lean cuts of beef with fat trimmed off. Low-sodium, lean deli meat. Dairy Low-fat (1%) or fat-free (skim) milk. Fat-free, low-fat, or reduced-fat cheeses. Nonfat, low-sodium ricotta or cottage cheese. Low-fat or nonfat yogurt. Low-fat, low-sodium cheese. Fats and oils Soft margarine without trans fats. Vegetable oil. Low-fat, reduced-fat, or light mayonnaise and salad dressings (reduced-sodium). Canola, safflower, olive, soybean, and  sunflower oils. Avocado. Seasoning and other foods Herbs. Spices. Seasoning mixes without salt. Unsalted popcorn and pretzels. Fat-free sweets. What foods are not recommended? The items listed may not be a complete list. Talk with your dietitian about what dietary choices are best for you. Grains Baked goods made with fat, such as croissants, muffins, or some breads. Dry pasta or rice meal packs. Vegetables Creamed or fried vegetables. Vegetables in a cheese sauce. Regular canned vegetables (not low-sodium or reduced-sodium). Regular canned tomato sauce and paste (not low-sodium or reduced-sodium). Regular tomato and vegetable juice (not low-sodium or reduced-sodium). Rosita Fire. Olives. Fruits Canned fruit in a light or heavy syrup. Fried fruit. Fruit in cream or butter sauce. Meat and other protein foods Fatty cuts of meat. Ribs. Fried meat. Tomasa Blase. Sausage. Bologna and other processed lunch meats. Salami. Fatback. Hotdogs. Bratwurst. Salted nuts and seeds. Canned beans with added salt. Canned or smoked fish. Whole eggs or egg yolks. Chicken or [place] with skin. Dairy Whole or 2% milk, cream, and half-and-half. Whole or full-fat cream cheese. Whole-fat or sweetened yogurt. Full-fat cheese. Nondairy creamers. Whipped toppings. Processed cheese  and cheese spreads. Fats and oils Butter. Stick margarine. Lard. Shortening. Ghee. Bacon fat. Tropical oils, such as coconut, palm kernel, or palm oil. Seasoning and other foods Salted popcorn and pretzels. Onion salt, garlic salt, seasoned salt, table salt, and sea salt. Worcestershire sauce. Tartar sauce. Barbecue sauce. Teriyaki sauce. Soy sauce, including reduced-sodium. Steak sauce. Canned and packaged gravies. Fish sauce. Oyster sauce. Cocktail sauce. Horseradish that you find on the shelf. Ketchup. Mustard. Meat flavorings and tenderizers. Bouillon cubes. Hot sauce and Tabasco sauce. Premade or packaged marinades. Premade or packaged taco seasonings.  Relishes. Regular salad dressings. Where to find more information:  National Heart, Lung, and Blood Institute: PopSteam.is  American Heart Association: www.heart.org Summary  The DASH eating plan is a healthy eating plan that has been shown to reduce high blood pressure (hypertension). It may also reduce your risk for type 2 diabetes, heart disease, and stroke.  With the DASH eating plan, you should limit salt (sodium) intake to 2,300 mg a day. If you have hypertension, you may need to reduce your sodium intake to 1,500 mg a day.  When on the DASH eating plan, aim to eat more fresh fruits and vegetables, whole grains, lean proteins, low-fat dairy, and heart-healthy fats.  Work with your health care provider or diet and nutrition specialist (dietitian) to adjust your eating plan to your individual calorie needs. This information is not intended to replace advice given to you by your health care provider. Make sure you discuss any questions you have with your health care provider. Document Revised: 09/13/2017 Document Reviewed: 09/24/2016 Elsevier Patient Education  2020 [employer]  DASH Eating Plan DASH stands for "Dietary Approaches to Stop Hypertension." The DASH eating plan is a healthy eating plan that has been shown to reduce high blood pressure (hypertension). It may also reduce your risk for type 2 diabetes, heart disease, and stroke. The DASH eating plan may also help with weight loss. What are tips for following this plan?  General guidelines  Avoid eating more than 2,300 mg (milligrams) of salt (sodium) a day. If you have hypertension, you may need to reduce your sodium intake to 1,500 mg a day.  Limit alcohol intake to no more than 1 drink a day for nonpregnant women and 2 drinks a day for men. One drink equals 12 oz of beer, 5 oz of wine, or 1 oz of hard liquor.  Work with your health care provider to maintain a healthy body weight or to lose weight. Ask what an  ideal weight is for you.  Get at least 30 minutes of exercise that causes your heart to beat faster (aerobic exercise) most days of the week. Activities may include walking, swimming, or biking.  Work with your health care provider or diet and nutrition specialist (dietitian) to adjust your eating plan to your individual calorie needs. Reading food labels   Check food labels for the amount of sodium per serving. Choose foods with less than 5 percent of the Daily Value of sodium. Generally, foods with less than 300 mg of sodium per serving fit into this eating plan.  To find whole grains, look for the word "whole" as the first word in the ingredient list. Shopping  Buy products labeled as "low-sodium" or "no salt added."  Buy fresh foods. Avoid canned foods and premade or frozen meals. Cooking  Avoid adding salt when cooking. Use salt-free seasonings or herbs instead of table salt or sea salt. Check with your health care provider or  pharmacist before using salt substitutes.  Do not fry foods. Cook foods using healthy methods such as baking, boiling, grilling, and broiling instead.  Cook with heart-healthy oils, such as olive, canola, soybean, or sunflower oil. Meal planning  Eat a balanced diet that includes: ? 5 or more servings of fruits and vegetables each day. At each meal, try to fill half of your plate with fruits and vegetables. ? Up to 6-8 servings of whole grains each day. ? Less than 6 oz of lean meat, poultry, or fish each day. A 3-oz serving of meat is about the same size as a deck of cards. One egg equals 1 oz. ? 2 servings of low-fat dairy each day. ? A serving of nuts, seeds, or beans 5 times each week. ? Heart-healthy fats. Healthy fats called Omega-3 fatty acids are found in foods such as flaxseeds and coldwater fish, like sardines, salmon, and mackerel.  Limit how much you eat of the following: ? Canned or prepackaged foods. ? Food that is high in trans fat, such  as fried foods. ? Food that is high in saturated fat, such as fatty meat. ? Sweets, desserts, sugary drinks, and other foods with added sugar. ? Full-fat dairy products.  Do not salt foods before eating.  Try to eat at least 2 vegetarian meals each week.  Eat more home-cooked food and less restaurant, buffet, and fast food.  When eating at a restaurant, ask that your food be prepared with less salt or no salt, if possible. What foods are recommended? The items listed may not be a complete list. Talk with your dietitian about what dietary choices are best for you. Grains Whole-grain or whole-wheat bread. Whole-grain or whole-wheat pasta. Brown rice. [place]ena Morrow. Bulgur. Whole-grain and low-sodium cereals. Pita bread. Low-fat, low-sodium crackers. Whole-wheat flour tortillas. Vegetables Fresh or frozen vegetables (raw, steamed, roasted, or grilled). Low-sodium or reduced-sodium tomato and vegetable juice. Low-sodium or reduced-sodium tomato sauce and tomato paste. Low-sodium or reduced-sodium canned vegetables. Fruits All fresh, dried, or frozen fruit. Canned fruit in natural juice (without added sugar). Meat and other protein foods Skinless chicken or [place]. Ground chicken or [place]. Pork with fat trimmed off. Fish and seafood. Egg whites. Dried beans, peas, or lentils. Unsalted nuts, nut butters, and seeds. Unsalted canned beans. Lean cuts of beef with fat trimmed off. Low-sodium, lean deli meat. Dairy Low-fat (1%) or fat-free (skim) milk. Fat-free, low-fat, or reduced-fat cheeses. Nonfat, low-sodium ricotta or cottage cheese. Low-fat or nonfat yogurt. Low-fat, low-sodium cheese. Fats and oils Soft margarine without trans fats. Vegetable oil. Low-fat, reduced-fat, or light mayonnaise and salad dressings (reduced-sodium). Canola, safflower, olive, soybean, and sunflower oils. Avocado. Seasoning and other foods Herbs. Spices. Seasoning mixes without salt. Unsalted popcorn and pretzels.  Fat-free sweets. What foods are not recommended? The items listed may not be a complete list. Talk with your dietitian about what dietary choices are best for you. Grains Baked goods made with fat, such as croissants, muffins, or some breads. Dry pasta or rice meal packs. Vegetables Creamed or fried vegetables. Vegetables in a cheese sauce. Regular canned vegetables (not low-sodium or reduced-sodium). Regular canned tomato sauce and paste (not low-sodium or reduced-sodium). Regular tomato and vegetable juice (not low-sodium or reduced-sodium). Angie Fava. Olives. Fruits Canned fruit in a light or heavy syrup. Fried fruit. Fruit in cream or butter sauce. Meat and other protein foods Fatty cuts of meat. Ribs. Fried meat. Berniece Salines. Sausage. Bologna and other processed lunch meats. Salami. Fatback. Hotdogs. Bratwurst. Salted  nuts and seeds. Canned beans with added salt. Canned or smoked fish. Whole eggs or egg yolks. Chicken or [place] with skin. Dairy Whole or 2% milk, cream, and half-and-half. Whole or full-fat cream cheese. Whole-fat or sweetened yogurt. Full-fat cheese. Nondairy creamers. Whipped toppings. Processed cheese and cheese spreads. Fats and oils Butter. Stick margarine. Lard. Shortening. Ghee. Bacon fat. Tropical oils, such as coconut, palm kernel, or palm oil. Seasoning and other foods Salted popcorn and pretzels. Onion salt, garlic salt, seasoned salt, table salt, and sea salt. Worcestershire sauce. Tartar sauce. Barbecue sauce. Teriyaki sauce. Soy sauce, including reduced-sodium. Steak sauce. Canned and packaged gravies. Fish sauce. Oyster sauce. Cocktail sauce. Horseradish that you find on the shelf. Ketchup. Mustard. Meat flavorings and tenderizers. Bouillon cubes. Hot sauce and Tabasco sauce. Premade or packaged marinades. Premade or packaged taco seasonings. Relishes. Regular salad dressings. Where to find more information:  National Heart, Lung, and Blood Institute:  PopSteam.iswww.nhlbi.nih.gov  American Heart Association: www.heart.org Summary  The DASH eating plan is a healthy eating plan that has been shown to reduce high blood pressure (hypertension). It may also reduce your risk for type 2 diabetes, heart disease, and stroke.  With the DASH eating plan, you should limit salt (sodium) intake to 2,300 mg a day. If you have hypertension, you may need to reduce your sodium intake to 1,500 mg a day.  When on the DASH eating plan, aim to eat more fresh fruits and vegetables, whole grains, lean proteins, low-fat dairy, and heart-healthy fats.  Work with your health care provider or diet and nutrition specialist (dietitian) to adjust your eating plan to your individual calorie needs. This information is not intended to replace advice given to you by your health care provider. Make sure you discuss any questions you have with your health care provider. Document Revised: 09/13/2017 Document Reviewed: 09/24/2016 Elsevier Patient Education  2020 [employer]
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
## 2020-02-19 ENCOUNTER — Ambulatory Visit: Payer: Medicare HMO | Admitting: Cardiology

## 2020-02-19 ENCOUNTER — Encounter: Payer: Self-pay | Admitting: Cardiology

## 2020-02-19 VITALS — BP 138/72 | HR 70 | Ht 73.0 in | Wt 334.4 lb

## 2020-02-19 DIAGNOSIS — R0609 Other forms of dyspnea: Secondary | ICD-10-CM

## 2020-02-19 DIAGNOSIS — R0600 Dyspnea, unspecified: Secondary | ICD-10-CM | POA: Diagnosis not present

## 2020-02-19 DIAGNOSIS — I38 Endocarditis, valve unspecified: Secondary | ICD-10-CM

## 2020-02-19 DIAGNOSIS — R600 Localized edema: Secondary | ICD-10-CM | POA: Diagnosis not present

## 2020-02-19 MED ORDER — TORSEMIDE 20 MG PO TABS
ORAL_TABLET | ORAL | 6 refills | Status: DC
Start: 2020-02-19 — End: 2020-05-27

## 2020-02-19 NOTE — Patient Instructions (Signed)
Medication Instructions:  - Your physician has recommended you make the following change in your medication:   1) Start torsemide 20 mg- take 2 tablets (40 mg) by mouth once daily for fluid  *If you need a refill on your cardiac medications before your next appointment, please call your pharmacy*   Lab Work: - none ordered  If you have labs (blood work) drawn today and your tests are completely normal, you will receive your results only by: Marland Kitchen MyChart Message (if you have MyChart) OR . A paper copy in the mail If you have any lab test that is abnormal or we need to change your treatment, we will call you to review the results.   Testing/Procedures: - Your physician has requested that you have an echocardiogram. Echocardiography is a painless test that uses sound waves to create images of your heart. It provides your doctor with information about the size and shape of your heart and how well your heart's chambers and valves are working. This procedure takes approximately one hour. There are no restrictions for this procedure. An IV may need to be started during the exam to inject an image enhancing agent for optimal pictures of your heart. Please drink some water prior to coming for your test.    Follow-Up: At [facility], you and your health needs are our priority.  As part of our continuing mission to provide you with exceptional heart care, we have created designated Provider Care Teams.  These Care Teams include your primary Cardiologist (physician) and Advanced Practice Providers (APPs -  Physician Assistants and Nurse Practitioners) who all work together to provide you with the care you need, when you need it.  We recommend signing up for the patient portal called "MyChart".  Sign up information is provided on this After Visit Summary.  MyChart is used to connect with patients for Virtual Visits (Telemedicine).  Patients are able to view lab/test results, encounter notes, upcoming  appointments, etc.  Non-urgent messages can be sent to your provider as well.   To learn more about what you can do with MyChart, go to [email or web address].    Your next appointment:   After the echocardiogram is completed   The format for your next appointment:   In Person  Provider:   Debbe Odea, MD   Other Instructions N/a   Echocardiogram An echocardiogram is a procedure that uses painless sound waves (ultrasound) to produce an image of the heart. Images from an echocardiogram can provide important information about:  Signs of coronary artery disease (CAD).  Aneurysm detection. An aneurysm is a weak or damaged part of an artery wall that bulges out from the normal force of blood pumping through the body.  Heart size and shape. Changes in the size or shape of the heart can be associated with certain conditions, including heart failure, aneurysm, and CAD.  Heart muscle function.  Heart valve function.  Signs of a past heart attack.  Fluid buildup around the heart.  Thickening of the heart muscle.  A tumor or infectious growth around the heart valves. Tell a health care provider about:  Any allergies you have.  All medicines you are taking, including vitamins, herbs, eye drops, creams, and over-the-counter medicines.  Any blood disorders you have.  Any surgeries you have had.  Any medical conditions you have.  Whether you are pregnant or may be pregnant. What are the risks? Generally, this is a safe procedure. However, problems may occur, including:  Allergic reaction  to dye (contrast) that may be used during the procedure. What happens before the procedure? No specific preparation is needed. You may eat and drink normally. What happens during the procedure?   An IV tube may be inserted into one of your veins.  You may receive contrast through this tube. A contrast is an injection that improves the quality of the pictures from your heart.  A  gel will be applied to your chest.  A wand-like tool (transducer) will be moved over your chest. The gel will help to transmit the sound waves from the transducer.  The sound waves will harmlessly bounce off of your heart to allow the heart images to be captured in real-time motion. The images will be recorded on a computer. The procedure may vary among health care providers and hospitals. What happens after the procedure?  You may return to your normal, everyday life, including diet, activities, and medicines, unless your health care provider tells you not to do that. Summary  An echocardiogram is a procedure that uses painless sound waves (ultrasound) to produce an image of the heart.  Images from an echocardiogram can provide important information about the size and shape of your heart, heart muscle function, heart valve function, and fluid buildup around your heart.  You do not need to do anything to prepare before this procedure. You may eat and drink normally.  After the echocardiogram is completed, you may return to your normal, everyday life, unless your health care provider tells you not to do that. This information is not intended to replace advice given to you by your health care provider. Make sure you discuss any questions you have with your health care provider. Document Revised: 01/22/2019 Document Reviewed: 11/03/2016 Elsevier Patient Education  [place].

## 2020-02-19 NOTE — Progress Notes (Signed)
Cardiology Office Note:    Date:  02/19/2020   ID:  Samuel Gallegos, DOB [birth year], MRN [id number]  PCP:  Allegra Grana, FNP  Cardiologist:  Debbe Odea, MD  Electrophysiologist:  None   Referring MD: Theadore Nan, NP   Chief Complaint  Patient presents with  . OTHER    ABN EKG/Heart Murmur c/o edema ankles/sob with exertion. Meds reviewed verbally with pt.    History of Present Illness:    Samuel Gallegos is a 69 y.o. male with a hx of anxiety who presents due to a heart murmur, edema and abnormal EKG.  Patient presented to primary care provider on 02/18/2020 where a heart murmur was noted on exam.  EKG reportedly showed sinus bradycardia with LVH.  Patient states having shortness of breath whenever he exerts himself for the past year now.  Symptoms have stayed roughly the same.  Symptoms are usually worse when he goes upstairs or exerts himself more.  Symptoms resolve with rest.  He denies any chest pain.  He is also noticed worsening lower extremity edema over the past several weeks.  He denies any history of heart disease, or smoking.  He is worried about getting admitted to the hospital or having any invasive procedures.  He will not want to do any further invasive procedures.  History reviewed. No pertinent past medical history.  Past Surgical History:  Procedure Laterality Date  . HERNIA REPAIR  1953    Current Medications: Current Meds  Medication Sig  . PARoxetine (PAXIL) 20 MG tablet Take 1 tablet (20 mg total) by mouth daily.     Allergies:   Amoxicillin   Social History   Socioeconomic History  . Marital status: Married    Spouse name: Not on file  . Number of children: Not on file  . Years of education: Not on file  . Highest education level: Not on file  Occupational History  . Not on file  Tobacco Use  . Smoking status: Never Smoker  . Smokeless tobacco: Never Used  Substance and Sexual Activity  . Alcohol use: Never  . Drug use: Never   . Sexual activity: Not on file  Other Topics Concern  . Not on file  Social History Narrative   [name] , drives truck as well   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   Married   Social Determinants of Health   Financial Resource Strain: Low Risk   . Difficulty of Paying Living Expenses: Not hard at all  Food Insecurity: No Food Insecurity  . Worried About Programme researcher, broadcasting/film/video in the Last Year: Never true  . Ran Out of Food in the Last Year: Never true  Transportation Needs: No Transportation Needs  . Lack of Transportation (Medical): No  . Lack of Transportation (Non-Medical): No  Physical Activity:   . Days of Exercise per Week:   . Minutes of Exercise per Session:   Stress: No Stress Concern Present  . Feeling of Stress : Not at all  Social Connections:   . Frequency of Communication with Friends and Family:   . Frequency of Social Gatherings with Friends and Family:   . Attends Religious Services:   . Active Member of Clubs or Organizations:   . Attends Banker Meetings:   Marland Kitchen Marital Status:      Family History: The patient's family history includes Cancer in his mother; Hypertension in his father.  ROS:   Please see the history of present illness.  All other systems reviewed and are negative.  EKGs/Labs/Other Studies Reviewed:    The following studies were reviewed today:   EKG:  EKG is  ordered today.  The ekg ordered today demonstrates sinus rhythm, left bundle branch block  Recent Labs: 02/18/2020: ALT 18; BUN 22; Creatinine, Ser 1.26; Hemoglobin 13.8; Platelets 181.0; Potassium 5.0; Sodium 138; TSH 1.63  Recent Lipid Panel    Component Value Date/Time   CHOL 153 02/18/2020 1009   TRIG 85.0 02/18/2020 1009   HDL 35.40 (L) 02/18/2020 1009   CHOLHDL 4 02/18/2020 1009   VLDL 17.0 02/18/2020 1009   LDLCALC 101 (H) 02/18/2020 1009    Physical Exam:    VS:  BP 138/72 (BP Location: Right Arm, Patient Position: Sitting, Cuff Size: Large)   Pulse 70   Ht  6\' 1"  (1.854 m)   Wt (!) 334 lb 6 oz (151.7 kg)   SpO2 98%   BMI 44.12 kg/m     Wt Readings from Last 3 Encounters:  02/19/20 (!) 334 lb 6 oz (151.7 kg)  02/18/20 (!) 333 lb 3.2 oz (151.1 kg)  01/18/20 (!) 336 lb 4 oz (152.5 kg)     GEN:  Well nourished, well developed in no acute distress HEENT: Normal NECK: No JVD; No carotid bruits LYMPHATICS: No lymphadenopathy CARDIAC: RRR, 2/4 diastolic murmur, rubs, gallops RESPIRATORY:  Clear to auscultation without rales, wheezing or rhonchi  ABDOMEN: Soft, non-tender, distended MUSCULOSKELETAL:  2+ edema; No deformity  SKIN: Warm and dry NEUROLOGIC:  Alert and oriented x 3 PSYCHIATRIC:  Normal affect   ASSESSMENT:    1. Dyspnea on exertion   2. Diastolic murmur   3. Edema leg    PLAN:    In order of problems listed above:  1. Patient with worsening dyspnea on exertion and edema.  2/4 diastolic murmur noted on exam.  Get echocardiogram to evaluate systolic and diastolic function, valvular pathology such as aortic insufficiency. 2. Diastolic murmur noted on exam, echocardiogram as above 3. 2+ pitting edema noted.  Abdominal distention noted.  Start torsemide 40 mg daily.  Patient will call us in 2 weeks regarding symptoms and edema.  Follow-up after echocardiogram.  Total encounter time 65 minutes  Greater than 50% was spent in counseling and coordination of care with the patient Time spent explaining to patient etiologies of edema, reasoning for testing and therapy.  All questions were answered.  Patient is scared about doing any invasive or further testing.  Had to counsel patient for a long time as to the benefits of at least getting an echocardiogram in the additional benefits of echocardiogram with guiding management.   This note was generated in part or whole with voice recognition software. Voice recognition is usually quite accurate but there are transcription errors that can and very often do occur. I apologize for any  typographical errors that were not detected and corrected.  Medication Adjustments/Labs and Tests Ordered: Current medicines are reviewed at length with the patient today.  Concerns regarding medicines are outlined above.  Orders Placed This Encounter  Procedures  . EKG 12-Lead  . ECHOCARDIOGRAM COMPLETE   Meds ordered this encounter  Medications  . torsemide (DEMADEX) 20 MG tablet    Sig: Take 2 tablets (40 mg) by mouth once daily    Dispense:  60 tablet    Refill:  6    There are no Patient Instructions on file for this visit.   Signed, Kate Sable, MD  02/19/2020 9:34 AM  [facility]

## 2020-02-23 ENCOUNTER — Telehealth: Payer: Self-pay | Admitting: Cardiology

## 2020-02-23 ENCOUNTER — Encounter: Payer: Self-pay | Admitting: Family

## 2020-02-23 ENCOUNTER — Ambulatory Visit (INDEPENDENT_AMBULATORY_CARE_PROVIDER_SITE_OTHER): Payer: Medicare HMO | Admitting: Family

## 2020-02-23 ENCOUNTER — Other Ambulatory Visit: Payer: Self-pay

## 2020-02-23 VITALS — BP 122/70 | HR 75 | Temp 96.3°F | Ht 76.0 in | Wt 321.8 lb

## 2020-02-23 DIAGNOSIS — E782 Mixed hyperlipidemia: Secondary | ICD-10-CM | POA: Insufficient documentation

## 2020-02-23 DIAGNOSIS — Z125 Encounter for screening for malignant neoplasm of prostate: Secondary | ICD-10-CM

## 2020-02-23 DIAGNOSIS — E785 Hyperlipidemia, unspecified: Secondary | ICD-10-CM

## 2020-02-23 DIAGNOSIS — B351 Tinea unguium: Secondary | ICD-10-CM | POA: Insufficient documentation

## 2020-02-23 DIAGNOSIS — R0602 Shortness of breath: Secondary | ICD-10-CM | POA: Diagnosis not present

## 2020-02-23 MED ORDER — ROSUVASTATIN CALCIUM 5 MG PO TABS: 5.0000 mg | ORAL_TABLET | Freq: Every day | ORAL | 3 refills | Status: DC

## 2020-02-23 NOTE — Patient Instructions (Addendum)
Your healthy cholesterol is low so I would tell you to eat more healthy 'fats' such as avocados, nuts, beans, and olive oil in moderation of course.   Start crestor  Labs in 6 weeks   Heart-Healthy Eating Plan Heart-healthy meal planning includes:  Eating less unhealthy fats.  Eating more healthy fats.  Making other changes in your diet. Talk with your doctor or a diet specialist (dietitian) to create an eating plan that is right for you. What is my plan? Your doctor may recommend an eating plan that includes:  Total fat: ______% or less of total calories a day.  Saturated fat: ______% or less of total calories a day.  Cholesterol: less than _________mg a day. What are tips for following this plan? Cooking Avoid frying your food. Try to bake, boil, grill, or broil it instead. You can also reduce fat by:  Removing the skin from poultry.  Removing all visible fats from meats.  Steaming vegetables in water or broth. Meal planning   At meals, divide your plate into four equal parts: ? Fill one-half of your plate with vegetables and green salads. ? Fill one-fourth of your plate with whole grains. ? Fill one-fourth of your plate with lean protein foods.  Eat 4-5 servings of vegetables per day. A serving of vegetables is: ? 1 cup of raw or cooked vegetables. ? 2 cups of raw leafy greens.  Eat 4-5 servings of fruit per day. A serving of fruit is: ? 1 medium whole fruit. ?  cup of dried fruit. ?  cup of fresh, frozen, or canned fruit. ?  cup of 100% fruit juice.  Eat more foods that have soluble fiber. These are apples, broccoli, carrots, beans, peas, and barley. Try to get 20-30 g of fiber per day.  Eat 4-5 servings of nuts, legumes, and seeds per week: ? 1 serving of dried beans or legumes equals  cup after being cooked. ? 1 serving of nuts is  cup. ? 1 serving of seeds equals 1 tablespoon. General information  Eat more home-cooked food. Eat less restaurant,  buffet, and fast food.  Limit or avoid alcohol.  Limit foods that are high in starch and sugar.  Avoid fried foods.  Lose weight if you are overweight.  Keep track of how much salt (sodium) you eat. This is important if you have high blood pressure. Ask your doctor to tell you more about this.  Try to add vegetarian meals each week. Fats  Choose healthy fats. These include olive oil and canola oil, flaxseeds, walnuts, almonds, and seeds.  Eat more omega-3 fats. These include salmon, mackerel, sardines, tuna, flaxseed oil, and ground flaxseeds. Try to eat fish at least 2 times each week.  Check food labels. Avoid foods with trans fats or high amounts of saturated fat.  Limit saturated fats. ? These are often found in animal products, such as meats, butter, and cream. ? These are also found in plant foods, such as palm oil, palm kernel oil, and coconut oil.  Avoid foods with partially hydrogenated oils in them. These have trans fats. Examples are stick margarine, some tub margarines, cookies, crackers, and other baked goods. What foods can I eat? Fruits All fresh, canned (in natural juice), or frozen fruits. Vegetables Fresh or frozen vegetables (raw, steamed, roasted, or grilled). Green salads. Grains Most grains. Choose whole wheat and whole grains most of the time. Rice and pasta, including brown rice and pastas made with whole wheat. Meats and other  proteins Lean, well-trimmed beef, veal, pork, and lamb. Chicken and [place] without skin. All fish and shellfish. Wild duck, rabbit, pheasant, and venison. Egg whites or low-cholesterol egg substitutes. Dried beans, peas, lentils, and tofu. Seeds and most nuts. Dairy Low-fat or nonfat cheeses, including ricotta and mozzarella. Skim or 1% milk that is liquid, powdered, or evaporated. Buttermilk that is made with low-fat milk. Nonfat or low-fat yogurt. Fats and oils Non-hydrogenated (trans-free) margarines. Vegetable oils, including  soybean, sesame, sunflower, olive, peanut, safflower, corn, canola, and cottonseed. Salad dressings or mayonnaise made with a vegetable oil. Beverages Mineral water. Coffee and tea. Diet carbonated beverages. Sweets and desserts Sherbet, gelatin, and fruit ice. Small amounts of dark chocolate. Limit all sweets and desserts. Seasonings and condiments All seasonings and condiments. The items listed above may not be a complete list of foods and drinks you can eat. Contact a dietitian for more options. What foods should I avoid? Fruits Canned fruit in heavy syrup. Fruit in cream or butter sauce. Fried fruit. Limit coconut. Vegetables Vegetables cooked in cheese, cream, or butter sauce. Fried vegetables. Grains Breads that are made with saturated or trans fats, oils, or whole milk. Croissants. Sweet rolls. Donuts. High-fat crackers, such as cheese crackers. Meats and other proteins Fatty meats, such as hot dogs, ribs, sausage, bacon, rib-eye roast or steak. High-fat deli meats, such as salami and bologna. Caviar. Domestic duck and goose. Organ meats, such as liver. Dairy Cream, sour cream, cream cheese, and creamed cottage cheese. Whole-milk cheeses. Whole or 2% milk that is liquid, evaporated, or condensed. Whole buttermilk. Cream sauce or high-fat cheese sauce. Yogurt that is made from whole milk. Fats and oils Meat fat, or shortening. Cocoa butter, hydrogenated oils, palm oil, coconut oil, palm kernel oil. Solid fats and shortenings, including bacon fat, salt pork, lard, and butter. Nondairy cream substitutes. Salad dressings with cheese or sour cream. Beverages Regular sodas and juice drinks with added sugar. Sweets and desserts Frosting. Pudding. Cookies. Cakes. Pies. Milk chocolate or white chocolate. Buttered syrups. Full-fat ice cream or ice cream drinks. The items listed above may not be a complete list of foods and drinks to avoid. Contact a dietitian for more  information. Summary  Heart-healthy meal planning includes eating less unhealthy fats, eating more healthy fats, and making other changes in your diet.  Eat a balanced diet. This includes fruits and vegetables, low-fat or nonfat dairy, lean protein, nuts and legumes, whole grains, and heart-healthy oils and fats. This information is not intended to replace advice given to you by your health care provider. Make sure you discuss any questions you have with your health care provider. Document Revised: 12/05/2017 Document Reviewed: 11/08/2017 Elsevier Patient Education  2020 [employer].  Mediterranean Diet A Mediterranean diet refers to food and lifestyle choices that are based on the traditions of countries located on the [employer]. This way of eating has been shown to help prevent certain conditions and improve outcomes for people who have chronic diseases, like kidney disease and heart disease. What are tips for following this plan? Lifestyle  Cook and eat meals together with your family, when possible.  Drink enough fluid to keep your urine clear or pale yellow.  Be physically active every day. This includes: ? Aerobic exercise like running or swimming. ? Leisure activities like gardening, walking, or housework.  Get 7-8 hours of sleep each night.  If recommended by your health care provider, drink red wine in moderation. This means 1 glass a day for  nonpregnant women and 2 glasses a day for men. A glass of wine equals 5 oz (150 mL). Reading food labels   Check the serving size of packaged foods. For foods such as rice and pasta, the serving size refers to the amount of cooked product, not dry.  Check the total fat in packaged foods. Avoid foods that have saturated fat or trans fats.  Check the ingredients list for added sugars, such as corn syrup. Shopping  At the grocery store, buy most of your food from the areas near the walls of the store. This includes: ? Fresh  fruits and vegetables (produce). ? Grains, beans, nuts, and seeds. Some of these may be available in unpackaged forms or large amounts (in bulk). ? Fresh seafood. ? Poultry and eggs. ? Low-fat dairy products.  Buy whole ingredients instead of prepackaged foods.  Buy fresh fruits and vegetables in-season from local farmers markets.  Buy frozen fruits and vegetables in resealable bags.  If you do not have access to quality fresh seafood, buy precooked frozen shrimp or canned fish, such as tuna, salmon, or sardines.  Buy small amounts of raw or cooked vegetables, salads, or olives from the deli or salad bar at your store.  Stock your pantry so you always have certain foods on hand, such as olive oil, canned tuna, canned tomatoes, rice, pasta, and beans. Cooking  Cook foods with extra-virgin olive oil instead of using butter or other vegetable oils.  Have meat as a side dish, and have vegetables or grains as your main dish. This means having meat in small portions or adding small amounts of meat to foods like pasta or stew.  Use beans or vegetables instead of meat in common dishes like chili or lasagna.  Experiment with different cooking methods. Try roasting or broiling vegetables instead of steaming or sauteing them.  Add frozen vegetables to soups, stews, pasta, or rice.  Add nuts or seeds for added healthy fat at each meal. You can add these to yogurt, salads, or vegetable dishes.  Marinate fish or vegetables using olive oil, lemon juice, garlic, and fresh herbs. Meal planning   Plan to eat 1 vegetarian meal one day each week. Try to work up to 2 vegetarian meals, if possible.  Eat seafood 2 or more times a week.  Have healthy snacks readily available, such as: ? Vegetable sticks with hummus. ? [place] yogurt. ? Fruit and nut trail mix.  Eat balanced meals throughout the week. This includes: ? Fruit: 2-3 servings a day ? Vegetables: 4-5 servings a day ? Low-fat dairy: 2  servings a day ? Fish, poultry, or lean meat: 1 serving a day ? Beans and legumes: 2 or more servings a week ? Nuts and seeds: 1-2 servings a day ? Whole grains: 6-8 servings a day ? Extra-virgin olive oil: 3-4 servings a day  Limit red meat and sweets to only a few servings a month What are my food choices?  Mediterranean diet ? Recommended  Grains: Whole-grain pasta. Brown rice. Bulgar wheat. Polenta. Couscous. Whole-wheat bread. [place].  Vegetables: Artichokes. Beets. Broccoli. Cabbage. Carrots. Eggplant. Green beans. Chard. Kale. Spinach. Onions. Leeks. Peas. Squash. Tomatoes. Peppers. Radishes.  Fruits: Apples. Apricots. Avocado. Berries. Bananas. Cherries. Dates. Figs. Grapes. Lemons. Melon. Oranges. Peaches. Plums. Pomegranate.  Meats and other protein foods: Beans. Almonds. Sunflower seeds. Pine nuts. Peanuts. [place]. Salmon. Scallops. Shrimp. [place]. Tilapia. Clams. Oysters. Eggs.  Dairy: Low-fat milk. Cheese. Greek yogurt.  Beverages: Water. Red wine. Herbal  tea.  Fats and oils: Extra virgin olive oil. Avocado oil. Grape seed oil.  Sweets and desserts: [place] yogurt with honey. Baked apples. Poached pears. Trail mix.  Seasoning and other foods: Basil. Cilantro. Coriander. Cumin. Mint. Parsley. Sage. Rosemary. Tarragon. Garlic. Oregano. Thyme. Pepper. Balsalmic vinegar. Tahini. Hummus. Tomato sauce. Olives. Mushrooms. ? Limit these  Grains: Prepackaged pasta or rice dishes. Prepackaged cereal with added sugar.  Vegetables: Deep fried potatoes (french fries).  Fruits: Fruit canned in syrup.  Meats and other protein foods: Beef. Pork. Lamb. Poultry with skin. Hot dogs. Tomasa Blase.  Dairy: Ice cream. Sour cream. Whole milk.  Beverages: Juice. Sugar-sweetened soft drinks. Beer. Liquor and spirits.  Fats and oils: Butter. Canola oil. Vegetable oil. Beef fat (tallow). Lard.  Sweets and desserts: Cookies. Cakes. Pies. Candy.  Seasoning and other foods: Mayonnaise.  Premade sauces and marinades. The items listed may not be a complete list. Talk with your dietitian about what dietary choices are right for you. Summary  The Mediterranean diet includes both food and lifestyle choices.  Eat a variety of fresh fruits and vegetables, beans, nuts, seeds, and whole grains.  Limit the amount of red meat and sweets that you eat.  Talk with your health care provider about whether it is safe for you to drink red wine in moderation. This means 1 glass a day for nonpregnant women and 2 glasses a day for men. A glass of wine equals 5 oz (150 mL). This information is not intended to replace advice given to you by your health care provider. Make sure you discuss any questions you have with your health care provider. Document Revised: 05/31/2016 Document Reviewed: 05/24/2016 Elsevier Patient Education  2020 [employer].

## 2020-02-23 NOTE — Assessment & Plan Note (Signed)
Discussed elevated cardiovascular risk, 17%, patient agreeable to starting low-dose Crestor.  Repeat liver enzymes in 6 weeks

## 2020-02-23 NOTE — Progress Notes (Signed)
Subjective:    Patient ID: Samuel Gallegos, male    DOB: October 27, 1950, 69 y.o.   MRN: [id number]  CC: Johan Creveling is a 69 y.o. male who presents today for follow up.   HPI: Feeling much better 'like day and night' since starting toresmide.  SOB has resolved. Now working 12 hours days and working picking up wood yesterday. NO cp, left arm pain, fatigue.  Has lost 11lbs in 'water weight'. Normal weight is 319 lb.    No decreased urine , urinary hesistancy.  Also complains of discolored toenails  Low HDL Elevated cardiovascular risk  Recently seen by cardiology, Dr. Charlestine Night for dyspnea on exertion.  Pending echocardiogram.Started on torsemide.  HISTORY:  History reviewed. No pertinent past medical history. Past Surgical History:  Procedure Laterality Date  . HERNIA REPAIR  1953   Family History  Problem Relation Age of Onset  . Cancer Mother   . Hypertension Father     Allergies: Amoxicillin Current Outpatient Medications on File Prior to Visit  Medication Sig Dispense Refill  . PARoxetine (PAXIL) 20 MG tablet Take 1 tablet (20 mg total) by mouth daily. 90 tablet 3  . torsemide (DEMADEX) 20 MG tablet Take 2 tablets (40 mg) by mouth once daily 60 tablet 6   No current facility-administered medications on file prior to visit.    Social History   Tobacco Use  . Smoking status: Never Smoker  . Smokeless tobacco: Never Used  Substance Use Topics  . Alcohol use: Never  . Drug use: Never    Review of Systems  Constitutional: Negative for chills and fever.  Respiratory: Negative for cough and shortness of breath (resolved).   Cardiovascular: Positive for leg swelling (improved). Negative for chest pain and palpitations.  Gastrointestinal: Negative for nausea and vomiting.  Genitourinary: Negative for decreased urine volume, frequency and urgency.      Objective:    BP 122/70   Pulse 75   Temp (!) 96.3 F (35.7 C) (Temporal)   Ht 6\' 4"  (1.93 m)   Wt (!) 321  lb 12.8 oz (146 kg)   SpO2 97%   BMI 39.17 kg/m  BP Readings from Last 3 Encounters:  02/23/20 122/70  02/19/20 138/72  02/18/20 130/80   Wt Readings from Last 3 Encounters:  02/23/20 (!) 321 lb 12.8 oz (146 kg)  02/19/20 (!) 334 lb 6 oz (151.7 kg)  02/18/20 (!) 333 lb 3.2 oz (151.1 kg)    Physical Exam Vitals reviewed.  Constitutional:      Appearance: He is well-developed.  Cardiovascular:     Rate and Rhythm: Regular rhythm.     Heart sounds: Normal heart sounds.     Comments: +1 bilateral lower extremity edema.  Nonpitting.  Bilateral palpable pedal pulses.  Noted thickened hardened discolored toenails bilaterally Pulmonary:     Effort: Pulmonary effort is normal. No respiratory distress.     Breath sounds: Normal breath sounds. No wheezing, rhonchi or rales.  Musculoskeletal:     Right lower leg: 1+ Edema present.     Left lower leg: 1+ Edema present.  Skin:    General: Skin is warm and dry.  Neurological:     Mental Status: He is alert.  Psychiatric:        Speech: Speech normal.        Behavior: Behavior normal.        Assessment & Plan:   Problem List Items Addressed This Visit      Musculoskeletal and  Integument   Onychomycosis   Relevant Orders   Ambulatory referral to Podiatry     Other   HLD (hyperlipidemia)    Discussed elevated cardiovascular risk, 17%, patient agreeable to starting low-dose Crestor.  Repeat liver enzymes in 6 weeks      Relevant Medications   rosuvastatin (CRESTOR) 5 MG tablet   Other Relevant Orders   Comprehensive metabolic panel   SOB (shortness of breath) on exertion    Resolved on torsemide. Pending echocardiogram with cardiology.  Patient is well-appearing today and very pleased with diuresis.  Weight is down.  Lower extremity swelling has improved.  No audible murmur on exam today.  We will continue to follow.       Other Visit Diagnoses    Screening for prostate cancer    -  Primary   Relevant Orders   PSA        I am having Martyn Rowzee start on rosuvastatin. I am also having him maintain his PARoxetine and torsemide.   Meds ordered this encounter  Medications  . rosuvastatin (CRESTOR) 5 MG tablet    Sig: Take 1 tablet (5 mg total) by mouth daily.    Dispense:  90 tablet    Refill:  3    Order Specific Question:   Supervising Provider    Answer:   Sherlene Shams [[id number]]    Return precautions given.   Risks, benefits, and alternatives of the medications and treatment plan prescribed today were discussed, and patient expressed understanding.   Education regarding symptom management and diagnosis given to patient on AVS.  Continue to follow with Allegra Grana, FNP for routine health maintenance.   [employer] and I agreed with plan.   Rennie Plowman, FNP

## 2020-02-23 NOTE — Assessment & Plan Note (Signed)
Resolved on torsemide. Pending echocardiogram with cardiology.  Patient is well-appearing today and very pleased with diuresis.  Weight is down.  Lower extremity swelling has improved.  No audible murmur on exam today.  We will continue to follow.

## 2020-02-23 NOTE — Telephone Encounter (Signed)
Patient wishes to transition from Dr Azucena Cecil to Dr Mariah Milling Is it ok to switch? Per policy both providers must agree with change Please advise

## 2020-02-24 NOTE — Telephone Encounter (Signed)
Okay with me to switch 

## 2020-03-01 ENCOUNTER — Telehealth: Payer: Self-pay | Admitting: Family

## 2020-03-01 NOTE — Telephone Encounter (Signed)
Pt called he was very upset with all the appts that he has coming up and wanted to cancel all of his appts within [facility]. Pt said that he came in for one thing and now he has all theses appts. He has to work and said he cant think about what's going on medical with him Please call pt to discuss further  Appts have not been cancelled yet

## 2020-03-01 NOTE — Telephone Encounter (Signed)
Also called [facility] & asked if they could move up patient's echo so he wouldn't have a month to worry. They have put patient on a wait list to be called with cancellations. I called patient to let him know this & that he may get a call.

## 2020-03-01 NOTE — Telephone Encounter (Signed)
I spoke with patient at great length & he was very stressed out as well as upset. He was overwhelmed by the amount of appointments that he had & referrals placed. He was not wanting to have have echo done because he was afraid he would have to keep going back for more appointments & get suck into spending all this unnecessary money. He stated that he has been trying to work & all he does it worry constantly. He can't concentrate on anything else. He [place] afraid that something may be wrong, but at the same time doesn't want to know. He is afraid at the echo they will have to stick an IV in him & told patient that I personally had one, this was not done. I told him it's like an [place] of your heart & all he has to do is lay there. He said every time he goes to the doctor someone wants to hurt him. I advised patient that he was in a catch 22 because he wants to not worry with this so he can work & take care of his family, but then if something is wrong or happens to him he can't take care of them.I advised that he at least  give cardiology a chance & see what is found. If they want further work-ups he is not okay with then he can refuse. Patient's mind was clearly racing & he was very worked up over all this. I asked since he is a God fearing man that he pray about this & give it to God. I asked if he had talked to his daughter that is an Charity fundraiser & he said that she wanted him to go have echo done. He stated that she hates me & doesn't ever come home to see me. He said that his wife didn't like him either & neither was a comfort to him. He wanted to be able to go to the doctor every couple years when needed, get his medication, go home & get better.   Needless to say this was a very long convo with patient & I believe that by then end of convo I had convinced him to have echo done as well f/u with cardiology. I asked that he at least give them a chance to make sure he had nothing serious going on. I let him know that cardiology  sees issues with the heart daily & they may not be as concerned as we had been here. I explained that a PCP is more of generalist & like to err on the side of caution. Patient said that he appreciated me being so nice to him & would try to give things a shot if he could get through the month of worrying.

## 2020-03-02 NOTE — Telephone Encounter (Signed)
Noted  

## 2020-03-11 ENCOUNTER — Telehealth: Payer: Self-pay | Admitting: Family

## 2020-03-11 ENCOUNTER — Ambulatory Visit: Payer: Medicare HMO | Admitting: Podiatry

## 2020-03-11 NOTE — Telephone Encounter (Signed)
I spoke with patient who wanted to let me know he still had not gotten a call if anyone had canceled their echo, so that he could have his done early. He also said that he just now started taking the Crestor, so I rescheduled labs for 6 weeks from today.

## 2020-03-11 NOTE — Telephone Encounter (Signed)
Pt would like a call back. He has some questions he would like to ask you.

## 2020-04-01 ENCOUNTER — Ambulatory Visit (INDEPENDENT_AMBULATORY_CARE_PROVIDER_SITE_OTHER): Payer: Medicare HMO

## 2020-04-01 ENCOUNTER — Other Ambulatory Visit: Payer: Self-pay

## 2020-04-01 DIAGNOSIS — I38 Endocarditis, valve unspecified: Secondary | ICD-10-CM

## 2020-04-04 ENCOUNTER — Telehealth: Payer: Self-pay | Admitting: Family

## 2020-04-04 NOTE — Telephone Encounter (Signed)
Pt states that he can not go to Dr appts that are scheduled at the hospital. Pt states that he needs a nerve pill to go to the dr. He states that he couldn't sleep at all thinking about having to go over there. Please advise.

## 2020-04-04 NOTE — Telephone Encounter (Signed)
Should this be held for Dr French Ana?

## 2020-04-05 ENCOUNTER — Other Ambulatory Visit: Payer: Medicare HMO

## 2020-04-05 NOTE — Telephone Encounter (Signed)
Call pt I believe he's referring to an order, echocardiogram? Dr Sandie Ano ordered this.   Please get more info , what test , when ?   We can discuss anxiety - please make an appointment .

## 2020-04-05 NOTE — Telephone Encounter (Signed)
Sorry routed to wrong provider   Please advise

## 2020-04-07 ENCOUNTER — Telehealth: Payer: Self-pay | Admitting: Family

## 2020-04-07 NOTE — Telephone Encounter (Signed)
Left message to return call 

## 2020-04-07 NOTE — Telephone Encounter (Signed)
Pt was returning the call.

## 2020-04-08 NOTE — Telephone Encounter (Addendum)
See previous encounter

## 2020-04-08 NOTE — Telephone Encounter (Signed)
Spoke with the Patient. States he was able to tough it out and had his imaging done already. He will call us back to be scheduled should his results, due to be received this coming Monday, warrant him having more hospital trips.   Patient says thank you for thinking of him, declines appointment at this time.

## 2020-04-11 ENCOUNTER — Encounter: Payer: Self-pay | Admitting: Family

## 2020-04-11 ENCOUNTER — Ambulatory Visit: Payer: Medicare HMO | Admitting: Family

## 2020-04-11 ENCOUNTER — Ambulatory Visit: Payer: Medicare HMO | Admitting: Cardiology

## 2020-04-11 ENCOUNTER — Other Ambulatory Visit: Payer: Self-pay

## 2020-04-11 VITALS — BP 156/80 | HR 72 | Ht 76.0 in | Wt 327.2 lb

## 2020-04-11 DIAGNOSIS — I77810 Thoracic aortic ectasia: Secondary | ICD-10-CM | POA: Diagnosis not present

## 2020-04-11 DIAGNOSIS — I38 Endocarditis, valve unspecified: Secondary | ICD-10-CM

## 2020-04-11 DIAGNOSIS — I5020 Unspecified systolic (congestive) heart failure: Secondary | ICD-10-CM

## 2020-04-11 DIAGNOSIS — E782 Mixed hyperlipidemia: Secondary | ICD-10-CM | POA: Diagnosis not present

## 2020-04-11 DIAGNOSIS — Z6839 Body mass index (BMI) 39.0-39.9, adult: Secondary | ICD-10-CM

## 2020-04-11 MED ORDER — LOSARTAN POTASSIUM 25 MG PO TABS
25.0000 mg | ORAL_TABLET | Freq: Every day | ORAL | 1 refills | Status: DC
Start: 2020-04-11 — End: 2020-05-19

## 2020-04-11 NOTE — Patient Instructions (Addendum)
Medication Instructions:  Your physician has recommended you make the following change in your medication:   CONTINUE Torsemide 40mg  daily  START Losartan 25mg  daily  *If you need a refill on your cardiac medications before your next appointment, please call your pharmacy*   Lab Work: Your physician recommends that you have lab work today: BMET  If you have labs (blood work) drawn today and your tests are completely normal, you will receive your results only by: Marland Kitchen MyChart Message (if you have MyChart) OR . A paper copy in the mail If you have any lab test that is abnormal or we need to change your treatment, we will call you to review the results.   Testing/Procedures: Your echocardiogram showed your pumping function of your heart is 20-25%. Normal pumping function is 50-65%. We would recommend getting a cardiac catheterization which lets [place] look for any blockages in your heart arteries that might be causing your reduced pumping function. If you decide you want to have this test done, simply call us and let [place] know.   Follow-Up: At [facility], you and your health needs are our priority.  As part of our continuing mission to provide you with exceptional heart care, we have created designated Provider Care Teams.  These Care Teams include your primary Cardiologist (physician) and Advanced Practice Providers (APPs -  Physician Assistants and Nurse Practitioners) who all work together to provide you with the care you need, when you need it.  We recommend signing up for the patient portal called "MyChart".  Sign up information is provided on this After Visit Summary.  MyChart is used to connect with patients for Virtual Visits (Telemedicine).  Patients are able to view lab/test results, encounter notes, upcoming appointments, etc.  Non-urgent messages can be sent to your provider as well.   To learn more about what you can do with MyChart, go to [email or web address].    Your next  appointment:   In 3-4 months   The format for your next appointment:   In Person  Provider:  Ida Rogue, MD (ONLY!)   Other Instructions   Heart Failure, Diagnosis  Heart failure is a condition in which the heart has trouble pumping blood because it has become weak or stiff. This means that the heart does not pump blood well enough for the body to stay healthy. For some people with heart failure, fluid may back up into the lungs. There may also be swelling (edema) in the lower legs. Heart failure is usually a long-term (chronic) condition. It is important for you to take good care of yourself and follow the treatment plan from your health care provider. What are the causes? This condition may be caused by:  High blood pressure (hypertension). Hypertension causes the heart muscle to work harder than normal. This makes the heart stiff or weak.  Coronary artery disease, or CAD. CAD is the buildup of cholesterol and fat (plaque) in the arteries of the heart.  Heart attack, also called myocardial infarction. This injures the heart muscle, making it hard for the heart to pump blood.  Abnormal heart valves. The valves do not open and close properly, forcing the heart to pump harder to keep the blood flowing.  Heart muscle disease (cardiomyopathy or myocarditis). This is damage to the heart muscle. It can increase the risk of heart failure.  Lung disease. The heart works harder when the lungs are not healthy.  Abnormal heart rhythms. These can lead to heart failure. What increases  the risk? The risk of heart failure increases as a person ages. This condition is also more likely to develop in people who:  Are overweight.  Are male.  Smoke or chew tobacco.  Abuse alcohol or illegal drugs.  Have taken medicines that can damage the heart, such as chemotherapy drugs.  Have diabetes.  Have abnormal heart rhythms.  Have thyroid problems.  Have low blood counts (anemia). What  are the signs or symptoms? Symptoms of this condition include:  Shortness of breath with activity, such as when climbing stairs.  A cough that does not go away.  Swelling of the feet, ankles, legs, or abdomen.  Losing weight for no reason.  Trouble breathing when lying flat (orthopnea).  Waking from sleep because of the need to sit up and get more air.  Rapid heartbeat.  Tiredness (fatigue) and loss of energy.  Feeling light-headed, dizzy, or close to fainting.  Loss of appetite.  Nausea.  Waking up more often during the night to urinate (nocturia).  Confusion. How is this diagnosed? This condition is diagnosed based on:  Your medical history, symptoms, and a physical exam.  Diagnostic tests, which may include: ? Echocardiogram. ? Electrocardiogram (ECG). ? Chest X-ray. ? Blood tests. ? Exercise stress test. ? Radionuclide scans. ? Cardiac catheterization and angiogram. How is this treated? Treatment for this condition is aimed at managing the symptoms of heart failure. Medicines Treatment may include medicines that:  Help lower blood pressure by relaxing (dilating) the blood vessels. These medicines are called ACE inhibitors (angiotensin-converting enzyme) and ARBs (angiotensin receptor blockers).  Cause the kidneys to remove salt and water from the blood through urination (diuretics).  Improve heart muscle strength and prevent the heart from beating too fast (beta blockers).  Increase the force of the heartbeat (digoxin). Healthy behavior changes     Treatment may also include making healthy lifestyle changes, such as:  Reaching and staying at a healthy weight.  Quitting smoking or chewing tobacco.  Eating heart-healthy foods.  Limiting or avoiding alcohol.  Stopping the use of illegal drugs.  Being physically active.  Other treatments Other treatments may include:  Procedures to open blocked arteries or repair damaged valves.  Placing a  pacemaker to improve heart function (cardiac resynchronization therapy).  Placing a device to treat serious abnormal heart rhythms (implantable cardioverter defibrillator, or ICD).  Placing a device to improve the pumping ability of the heart (left ventricular assist device, or LVAD).  Receiving a healthy heart from a donor (heart transplant). This is done when other treatments have not helped. Follow these instructions at home:  Manage other health conditions as told by your health care provider. These may include hypertension, diabetes, thyroid disease, or abnormal heart rhythms.  Get ongoing education and support as needed. Learn as much as you can about heart failure.  Keep all follow-up visits as told by your health care provider. This is important. Summary  Heart failure is a condition in which the heart has trouble pumping blood because it has become weak or stiff.  This condition is caused by high blood pressure and other diseases of the heart and lungs.  Symptoms of this condition include shortness of breath, tiredness (fatigue), nausea, and swelling of the feet, ankles, legs, or abdomen.  Treatments for this condition may include medicines, lifestyle changes, and surgery.  Manage other health conditions as told by your health care provider. This information is not intended to replace advice given to you by your health care  provider. Make sure you discuss any questions you have with your health care provider. Document Revised: 12/19/2018 Document Reviewed: 12/19/2018 Elsevier Patient Education  2020 [employer].

## 2020-04-11 NOTE — Progress Notes (Signed)
Office Visit    Patient Name: Samuel Gallegos Date of Encounter: 04/11/2020  Primary Care Provider:  Allegra Grana, FNP Primary Cardiologist:  Julien Nordmann, MD Electrophysiologist:  None   Chief Complaint    Samuel Gallegos is a 69 y.o. male with a hx of HFrEF, valvular disease, anxiety, HLD presents today for follow up after echocardiogram.    Past Medical History    History reviewed. No pertinent past medical history. Past Surgical History:  Procedure Laterality Date   HERNIA REPAIR  1953    Allergies  Allergies  Allergen Reactions   Amoxicillin Itching    History of Present Illness    Samuel Gallegos is a 69 y.o. male with a hx of HFrEF, valvular disease, anxiety, HLD last seen 02/19/2020 by Dr. Azucena Cecil..  Seen in clinic 02/19/2020 by Dr. Azucena Cecil for evaluation of murmur, DOE, lower extremity edema.  He was started on torsemide 40 mg daily.  Echocardiogram 04/01/2020 with LVEF 20 to 25%, global hypokinesis of the LV, LV severely dilated, grade 2 diastolic dysfunction, RV normal size and function with moderately elevated PASP (47.9 mmHg), LA moderately dilated, mild to moderate MR, mild to moderate AI, mild/moderate aortic valve sclerosis without stenosis, dilation of ascending aorta measuring 37 mm.  He has decided to switch to Dr. Mariah Milling as he has a number of friends who see Dr. Mariah Milling.  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 Presently working on [employer]. Reports "no trouble breathing". Continues to take his two tablets of Torsemide each morning. Reports no edema since starting his fluid pill.  Reports resolution of his dyspnea on exertion.  Home blood pressure well controlled per his report.  He is present today with his friend.  We reviewed echocardiogram in detail.  We discussed the indication for cardiac catheterization to rule out ischemia as etiology.  He declines as he is not interested in any additional invasive procedures.  We reviewed  the risks and benefits of not undergoing ischemia eval.  He verbalized understanding.  We discussed guideline directed therapy for heart failure he was agreeable to medication changes.  He is very wary of multiple physician appointments.    Shares with me that his daughter is a Engineer, civil (consulting) and his son-in-law is a Development worker, community.  Encouraged to discuss his diagnosis of heart failure with them.  EKGs/Labs/Other Studies Reviewed:   The following studies were reviewed today:  Echo 04/01/20 1. Left ventricular ejection fraction, by estimation, is 20 to 25%. The  left ventricle has severely decreased function. The left ventricle  demonstrates global hypokinesis. The left ventricular internal cavity size  was severely dilated. Left ventricular  diastolic parameters are consistent with Grade II diastolic dysfunction  (pseudonormalization).   2. Right ventricular systolic function is normal. The right ventricular  size is normal. There is moderately elevated pulmonary artery systolic  pressure. The estimated right ventricular systolic pressure is 47.9 mmHg.   3. Left atrial size was moderately dilated.   4. Mild to moderate mitral valve regurgitation.   5. The aortic valve is normal in structure. Aortic valve regurgitation is  mild to moderate. Mild to moderate aortic valve sclerosis/calcification is  present, without any evidence of aortic stenosis.   6. There is dilatation of the ascending aorta measuring 37 mm.   EKG:  Patient declines EKG today.   Recent Labs: 02/18/2020: ALT 18; BUN 22; Creatinine, Ser 1.26; Hemoglobin 13.8; Platelets 181.0; Potassium 5.0; Sodium 138; TSH 1.63  Recent Lipid Panel  Component Value Date/Time   CHOL 153 02/18/2020 1009   TRIG 85.0 02/18/2020 1009   HDL 35.40 (L) 02/18/2020 1009   CHOLHDL 4 02/18/2020 1009   VLDL 17.0 02/18/2020 1009   LDLCALC 101 (H) 02/18/2020 1009    Home Medications   Current Meds  Medication Sig   PARoxetine (PAXIL) 20 MG tablet Take 1  tablet (20 mg total) by mouth daily.   rosuvastatin (CRESTOR) 5 MG tablet Take 1 tablet (5 mg total) by mouth daily.   torsemide (DEMADEX) 20 MG tablet Take 2 tablets (40 mg) by mouth once daily      Review of Systems   Review of Systems  Constitutional: Negative for chills, fever and malaise/fatigue.  Cardiovascular: Negative for chest pain, dyspnea on exertion, leg swelling, near-syncope, orthopnea, palpitations and syncope.  Respiratory: Negative for cough, shortness of breath and wheezing.   Gastrointestinal: Negative for nausea and vomiting.  Neurological: Negative for dizziness, light-headedness and weakness.   All other systems reviewed and are otherwise negative except as noted above.  Physical Exam    VS:  BP (!) 156/80    Pulse 72    Ht 6\' 4"  (1.93 m)    Wt (!) 327 lb 4 oz (148.4 kg)    SpO2 97%    BMI 39.83 kg/m  , BMI Body mass index is 39.83 kg/m. GEN: Well nourished, overweight,well developed, in no acute distress. HEENT: normal. Neck: Supple, no JVD, carotid bruits, or masses. Cardiac: RRR, no murmurs, rubs, or gallops. No clubbing, cyanosis, edema.  Radials/DP/PT 2+ and equal bilaterally.  Respiratory:  Respirations regular and unlabored, clear to auscultation bilaterally. GI: Soft, nontender, nondistended, BS + x 4. MS: No deformity or atrophy. Skin: Warm and dry, no rash. Neuro:  Strength and sensation are intact. Psych: Normal affect.  Assessment & Plan    1. HFrEF - New diagnosis with LVEF 20-25%. Overall euvolemic on exam. NYHA I-II. Due to newly noted reduced LVEF strong indication for evaluation via cardiac catheterization. He adamantly refuses ischemia evaluation by cardiac catheterization.  Reviewed risks and benefits of not undergoing ischemia eval and he continues to decline any additional testing including cardiac catheterization or stress test.  Reports resolution of edema and DOE since addition of torsemide at last office visit.  He is agreeable for  up titration of heart failure therapies.  Start losartan 25 mg daily.  BMP today for monitoring.  Anticipate up titration of his medical therapy will be limited by his willingness to follow-up with medical providers.  I did not opt to start [facility] as I anticipate his Medicare will require patient assistance and I do not anticipate him being agreeable to complete paperwork.  We could consider addition of Coreg and Spironolactone at follow up with [facility] or primary care if he is agreeable. Low sodium diet and fluid restriction discussed.   2. Valvular heart disease -Echo 03/2020 mild to moderate AI, mild to moderate MR. Continue optimal blood pressure and volume control.  3. Dilation of ascending aorta -measures 37 mm by echo 03/2020.  Consider repeat echo or CT imaging in 1 year.  Continue optimal blood pressure control.  4. HLD - Continue Crestor 5 mg daily.  5. Anxiety - Continue to follow with primary care.  6. Obesity - Weight loss via diet and exercise encouraged. In setting of HF, valvular heart disease, HLD he has multiple comorbidities that could be made worse due to obesity.   Disposition: Follow up in 3 month(s) with Dr.  Gollan. He declines earlier follow up.   [name], NP 04/11/2020, 7:59 PM

## 2020-04-12 LAB — BASIC METABOLIC PANEL
BUN/Creatinine Ratio: 18 (ref 10–24)
BUN: 25 mg/dL (ref 8–27)
CO2: 24 mmol/L (ref 20–29)
Calcium: 9.4 mg/dL (ref 8.6–10.2)
Chloride: 101 mmol/L (ref 96–106)
Creatinine, Ser: 1.36 mg/dL — ABNORMAL HIGH (ref 0.76–1.27)
GFR calc Af Amer: 61 mL/min/{1.73_m2} (ref >59)
GFR calc non Af Amer: 52 mL/min/{1.73_m2} — ABNORMAL LOW (ref >59)
Glucose: 111 mg/dL — ABNORMAL HIGH (ref 65–99)
Potassium: 4.8 mmol/L (ref 3.5–5.2)
Sodium: 140 mmol/L (ref 134–144)

## 2020-04-19 ENCOUNTER — Telehealth: Payer: Self-pay | Admitting: Cardiovascular Disease

## 2020-04-19 NOTE — Telephone Encounter (Signed)
No answer. Left message to call back.   

## 2020-04-19 NOTE — Telephone Encounter (Signed)
Patient daughter Samuel Gallegos calling  Would like to discuss/review last office visit information that was completed with C Walker Patient daughter is aware patient can be non compliant at times and would like to clarify, although all final decisions will be made with patient  Please call to discuss - Samuel Gallegos states they Samuel Gallegos and husband Samuel Gallegos) are best reached on Wednesday

## 2020-04-20 NOTE — Telephone Encounter (Signed)
DPR on file. Spoke with patients daughter Misty Stanley and his son in Social worker, who are a Engineer, civil (consulting) and physician. They have several questions regarding the patients recent cardiac diagnosis and the plan of care.  Misty Stanley sts that the patient is a bit leery of doctors, medication , and procedures. The patient has not communicated well with them regarding his recent diagnosis. Misty Stanley sts that the patient requested that her and her husband contact our office on his behalf.  Belinda Fisher that the questions they have would be better answered by a provider. The patient was last seen by Gillian Shields, NP. Littie Deeds that I will fwd the message to [facility] to call them when she has the opportunity.  Misty Stanley and her husband were both very appreciative for the assistance.

## 2020-04-21 ENCOUNTER — Encounter: Payer: Self-pay | Admitting: Family

## 2020-04-21 DIAGNOSIS — I5020 Unspecified systolic (congestive) heart failure: Secondary | ICD-10-CM

## 2020-04-21 NOTE — Telephone Encounter (Signed)
Called Misty Stanley (Mr. Moynahan's daughter) and she was able to conference-call her husband, Trey Paula. They live in [place] but have received Mr. Pohlmann's permission to be involved in his medical care. Trey Paula is a psychiatrist and Misty Stanley is a Engineer, civil (consulting).  Mr. Goehring has significant anxiety regarding medical providers and they shared with me that this goes back to his childhood.  We reviewed his echocardiogram in detail as well as heart failure diagnosis and recommendations.  Discussed indication for ischemic evaluation. They do not believe he will be agreeable to cardiac catheterization. We discussed possibility of cardiac CTA and they will discuss with him. They did indicated he would be more agreeable to cath if the CTA was abnormal.  Discussed etiologies of heart failure. Reviewed <2L fluid restriction and recommendation for low sodium diet.   We discussed guideline directed medical therapy and Entresto, in particular.   Additional history obtained via phone call:  No etoh or smoking. No tachycardia. BP elevated in clinic due to white coat hypertension, but checked daily at home with automatic cuff and normal.   They do have access to his MyChart. Will send additional info them.   [name], NP

## 2020-04-21 NOTE — Telephone Encounter (Signed)
Called daughter, Misty Stanley, at 9:37 am. Left VM per her request and let her know I will call back at 10:30A.   [name], NP

## 2020-04-21 NOTE — Telephone Encounter (Signed)
Spoke with Samuel Gallegos. We agreed upon phone call at 12:30pm today so that she and her husband can conference call. Will call again at 12:30 PM.   [name], NP

## 2020-04-22 ENCOUNTER — Telehealth: Payer: Self-pay | Admitting: Family

## 2020-04-22 DIAGNOSIS — I5020 Unspecified systolic (congestive) heart failure: Secondary | ICD-10-CM

## 2020-04-22 NOTE — Telephone Encounter (Signed)
Reviewed the message below with Gillian Shields, NP. She has agreed to call the patient to discuss the cath wit him.

## 2020-04-22 NOTE — Telephone Encounter (Signed)
Spoke with Mr. Bienvenue. He continues to be understandably anxious about cardiac catheterization but wishes to have it completed for workup of his new onset combined systolic/diastolic heart failure. We reviewed the catheterization procedure as well as the risks and benefits. The patient understands that risks include but are not limited to stroke (1 in 1000), death (1 in 1000), kidney failure [usually temporary] (1 in 500), bleeding (1 in 200), allergic reaction [possibly serious] (1 in 200), and agrees to proceed.   Will work with triage team on Monday regarding scheduling right and left heart cath. He would prefer to have it done as soon as possible so he does not have to worry about the procedure. We discussed covid testing and he was agreeable. His wife will drive him to the procedure.   He did request an anti-anxiety agent to "help me get to the hospital" as he has significant anxiety. Will discuss with the provider performing procedure if they have a recommendation. He has never taken an anxiolytic other than Paxil.   [name], NP

## 2020-04-22 NOTE — Telephone Encounter (Signed)
Please call to discuss heart cath. Patient has a "fear of the hospital" and would like to discuss the procedure. Patient is very anxious. Please call to discuss.

## 2020-04-25 ENCOUNTER — Encounter: Payer: Self-pay | Admitting: Family

## 2020-04-25 ENCOUNTER — Other Ambulatory Visit (INDEPENDENT_AMBULATORY_CARE_PROVIDER_SITE_OTHER): Payer: Medicare HMO

## 2020-04-25 ENCOUNTER — Other Ambulatory Visit: Payer: Self-pay

## 2020-04-25 ENCOUNTER — Other Ambulatory Visit: Payer: Self-pay | Admitting: Family

## 2020-04-25 DIAGNOSIS — Z125 Encounter for screening for malignant neoplasm of prostate: Secondary | ICD-10-CM | POA: Diagnosis not present

## 2020-04-25 DIAGNOSIS — I5020 Unspecified systolic (congestive) heart failure: Secondary | ICD-10-CM

## 2020-04-25 DIAGNOSIS — E785 Hyperlipidemia, unspecified: Secondary | ICD-10-CM | POA: Diagnosis not present

## 2020-04-25 LAB — COMPREHENSIVE METABOLIC PANEL
ALT: 11 U/L (ref 0–53)
AST: 12 U/L (ref 0–37)
Albumin: 4.2 g/dL (ref 3.5–5.2)
Alkaline Phosphatase: 66 U/L (ref 39–117)
BUN: 39 mg/dL — ABNORMAL HIGH (ref 6–23)
CO2: 30 mEq/L (ref 19–32)
Calcium: 9.5 mg/dL (ref 8.4–10.5)
Chloride: 99 mEq/L (ref 96–112)
Creatinine, Ser: 1.45 mg/dL (ref 0.40–1.50)
GFR: 48.06 mL/min — ABNORMAL LOW (ref >60.00)
Glucose, Bld: 112 mg/dL — ABNORMAL HIGH (ref 70–99)
Potassium: 4.4 mEq/L (ref 3.5–5.1)
Sodium: 139 mEq/L (ref 135–145)
Total Bilirubin: 0.8 mg/dL (ref 0.2–1.2)
Total Protein: 6.7 g/dL (ref 6.0–8.3)

## 2020-04-25 LAB — PSA: PSA: 0.68 ng/mL (ref 0.10–4.00)

## 2020-04-25 NOTE — Addendum Note (Signed)
Addended by: [name] on: 04/25/2020 04:35 PM   Modules accepted: Orders, SmartSet

## 2020-04-25 NOTE — Progress Notes (Signed)
04/25/20 addendum:  After multiple discussions with Mr. Samuel Gallegos's family members as well as the patient he is agreeable to cardiac catheterization. Discussion of risks and benefits was had via telephone and the patient understands that risks include but are not limited to stroke (1 in 1000), death (1 in 1000), kidney failure [usually temporary] (1 in 500), bleeding (1 in 200), allergic reaction [possibly serious] (1 in 200), and agrees to proceed.   He has been set up for cardiac catheterization 04/28/20 with Dr. Mariah Milling. Orders have been placed.   We will consider transitioning his Losartan to [facility] after his cardiac catheterization.   [name], NP

## 2020-04-25 NOTE — Telephone Encounter (Signed)
Called Mr. Edelen to review cardiac catheterization instructions. Cardiac cath instructions sent via Mychart. He verbalized understanding of time to arrive, lab work, etc. Verbalized understanding to remain NPO after midnight on the day of his procedure.   [name], NP

## 2020-04-25 NOTE — Telephone Encounter (Addendum)
Patients right and left cardiac cath is scheduled with Dr. Mariah Milling on [facility] 04/18/20 @ 8:30am with a 7:30 am arrival time. Patient will need a COVID test on 04/[facility] Patient will need a Cbc. [facility] order placed [facility].

## 2020-04-26 ENCOUNTER — Other Ambulatory Visit
Admission: RE | Admit: 2020-04-26 | Discharge: 2020-04-26 | Disposition: A | Discharge status: Home / Self Care | Payer: Medicare HMO | Source: Ambulatory Visit | Attending: Family | Admitting: Family

## 2020-04-26 ENCOUNTER — Other Ambulatory Visit: Payer: Self-pay

## 2020-04-26 ENCOUNTER — Other Ambulatory Visit
Admission: RE | Admit: 2020-04-26 | Discharge: 2020-04-26 | Disposition: A | Discharge status: Home / Self Care | Payer: Medicare HMO | Source: Home / Self Care | Attending: Family | Admitting: Family

## 2020-04-26 DIAGNOSIS — Z01812 Encounter for preprocedural laboratory examination: Secondary | ICD-10-CM | POA: Insufficient documentation

## 2020-04-26 DIAGNOSIS — I5020 Unspecified systolic (congestive) heart failure: Secondary | ICD-10-CM

## 2020-04-26 DIAGNOSIS — Z20822 Contact with and (suspected) exposure to covid-19: Secondary | ICD-10-CM | POA: Insufficient documentation

## 2020-04-26 LAB — CBC WITH DIFFERENTIAL/PLATELET
Abs Immature Granulocytes: 0.03 K/uL (ref 0.00–0.07)
Basophils Absolute: 0.1 K/uL (ref 0.0–0.1)
Basophils Relative: 1 %
Eosinophils Absolute: 0.1 K/uL (ref 0.0–0.5)
Eosinophils Relative: 1 %
HCT: 43.6 % (ref 39.0–52.0)
Hemoglobin: 14.5 g/dL (ref 13.0–17.0)
Immature Granulocytes: 0 %
Lymphocytes Relative: 23 %
Lymphs Abs: 2 K/uL (ref 0.7–4.0)
MCH: 28.7 pg (ref 26.0–34.0)
MCHC: 33.3 g/dL (ref 30.0–36.0)
MCV: 86.2 fL (ref 80.0–100.0)
Monocytes Absolute: 0.8 K/uL (ref 0.1–1.0)
Monocytes Relative: 9 %
Neutro Abs: 5.7 K/uL (ref 1.7–7.7)
Neutrophils Relative %: 66 %
Platelets: 235 K/uL (ref 150–400)
RBC: 5.06 MIL/uL (ref 4.22–5.81)
RDW: 13.6 % (ref 11.5–15.5)
WBC: 8.6 K/uL (ref 4.0–10.5)
nRBC: 0 % (ref 0.0–0.2)

## 2020-04-26 LAB — SARS CORONAVIRUS 2 (TAT 6-24 HRS): SARS Coronavirus 2: NEGATIVE

## 2020-04-27 ENCOUNTER — Telehealth: Payer: Self-pay

## 2020-04-27 NOTE — Telephone Encounter (Signed)
Called patient and explained that we do want him to come in for labs again. Patient got upset over the quantity of blood being taken as he also goes to the hospital for his heart and blood work. I explained to the patient that it was to follow up on the dehydration and how the upcoming labs would be a check. Patient said a few obscenities and claimed he will not come in for any more blood work and then hung up before scheduling for a follow up appointment or repeat labs.

## 2020-04-27 NOTE — Telephone Encounter (Signed)
-----   Message from Allegra Grana, FNP sent at 04/25/2020  4:03 PM EDT ----- Lorain Childes caitlin He looks dry , let me repeat it one more time and if worsens, we may need to scale back the demadex  CALL-  Normal electrolytes and overall kidney function. His BUN had increased making me think dehydration , torsemide has contributed to his slight increase in Crt.  Please make appt  lab appt in 2-3 weeks and ask him to drink more water on the day of his labs.  Make with me in 6 weeks as well in person PSA normal   Let me know if cannot reach patient.

## 2020-04-28 ENCOUNTER — Ambulatory Visit
Admission: RE | Admit: 2020-04-28 | Discharge: 2020-04-28 | Disposition: A | Discharge status: Home / Self Care | Payer: Medicare HMO | Attending: Cardiovascular Disease | Admitting: Cardiovascular Disease

## 2020-04-28 ENCOUNTER — Encounter
Admission: RE | Disposition: A | Discharge status: Home / Self Care | Payer: Self-pay | Source: Home / Self Care | Attending: Cardiovascular Disease

## 2020-04-28 DIAGNOSIS — R931 Abnormal findings on diagnostic imaging of heart and coronary circulation: Secondary | ICD-10-CM

## 2020-04-28 DIAGNOSIS — I5020 Unspecified systolic (congestive) heart failure: Secondary | ICD-10-CM

## 2020-04-28 SURGERY — RIGHT/LEFT HEART CATH AND CORONARY ANGIOGRAPHY
Anesthesia: Moderate Sedation | Laterality: Bilateral

## 2020-04-28 MED ORDER — SODIUM CHLORIDE 0.9% FLUSH: 3.0000 mL | INTRAVENOUS | Status: DC | PRN

## 2020-04-28 MED ORDER — SODIUM CHLORIDE 0.9% FLUSH: 3.0000 mL | Freq: Two times a day (BID) | INTRAVENOUS | Status: DC

## 2020-04-28 MED ORDER — SODIUM CHLORIDE 0.9 % IV SOLN: INTRAVENOUS | Status: DC

## 2020-04-28 MED ORDER — ASPIRIN 81 MG PO CHEW: 81.0000 mg | CHEWABLE_TABLET | ORAL | Status: DC

## 2020-04-28 MED ORDER — SODIUM CHLORIDE 0.9 % IV SOLN: 250.0000 mL | INTRAVENOUS | Status: DC | PRN

## 2020-04-29 ENCOUNTER — Telehealth: Payer: Self-pay | Admitting: Cardiovascular Disease

## 2020-04-29 DIAGNOSIS — I5020 Unspecified systolic (congestive) heart failure: Secondary | ICD-10-CM

## 2020-04-29 NOTE — Telephone Encounter (Signed)
Patient spouse calling  Patient has thought about it and would like to reschedule cath Please call to discuss

## 2020-04-29 NOTE — Telephone Encounter (Signed)
Spoke with the patient.  Patients cath scheduled on 04/28/20 was cancelled due to the patients behavior during the registration process. Patient would like to reschedule his cardiac cath. He was remorseful and sts that "he will try harder this time."  Discussed with Gillian Shields, NP and it would be more appropriate to send the update to Dr.Gollan so that he can give his recommendation on how to proceed.  Patient made aware that the message will be sent to Dr. Mariah Milling.

## 2020-05-02 NOTE — Telephone Encounter (Addendum)
Attempted to contact patient x2 on the patients home phone. Line was busy. Called the patients cell. [email or web address].

## 2020-05-03 MED ORDER — METOPROLOL TARTRATE 100 MG PO TABS
ORAL_TABLET | ORAL | 0 refills | Status: DC
Start: 2020-05-03 — End: 2020-08-08

## 2020-05-03 NOTE — Telephone Encounter (Addendum)
[name], NP  You 22 hours ago (4:25 PM)   Spoke with TG about patient. Feels cardiac CTA may be more appropriate due to his anxiety. I have previously discussed this test with his children who have discussed it with him.     Patient made aware of the recommended plan of care. Patient is agreeable with proceeding with the Cardiac CTA. Advised the patient that the test will need to go through the prior auth process and once approved by insurance he will be contacted to schedule.  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 Patient rqst a call back at another time to review the instructions.  Order placed for the Cardiac CT. Metoprolol 100mg  tab sen tot the patients pharmacy.

## 2020-05-03 NOTE — Addendum Note (Signed)
Addended by: Jarvis Newcomer on: 05/03/2020 03:17 PM   Modules accepted: Orders

## 2020-05-04 NOTE — Telephone Encounter (Signed)
Spoke with patient and gave him his instructions for his Coronary CTA that was ordered yesterday. I also sent the instructions through MyChart.  Patient verbalized understanding and agreed with plan.

## 2020-05-19 ENCOUNTER — Telehealth: Payer: Self-pay | Admitting: Cardiovascular Disease

## 2020-05-19 DIAGNOSIS — I5020 Unspecified systolic (congestive) heart failure: Secondary | ICD-10-CM

## 2020-05-19 MED ORDER — ENTRESTO 24-26 MG PO TABS: 1.0000 | ORAL_TABLET | Freq: Two times a day (BID) | ORAL | 2 refills | Status: DC

## 2020-05-19 NOTE — Addendum Note (Signed)
Addended by: [name] on: 05/19/2020 04:41 PM   Modules accepted: Orders

## 2020-05-19 NOTE — Telephone Encounter (Signed)
Patient calling  States that CTs are weeks out and wants to know if we know that and if we want to make any changes to medications while he waits Please call to discuss

## 2020-05-19 NOTE — Telephone Encounter (Signed)
Returned call to patient and gave him number for CT scheduling Sheralyn Boatman.  Pt was appreciative of call and had no further questions at this time.

## 2020-05-19 NOTE — Telephone Encounter (Signed)
Patient calling to check status of scheduling CT

## 2020-05-19 NOTE — Telephone Encounter (Signed)
We know from his echocardiogram that his heart pumping function is low. The cardiac CT is to see if there is a blockage causing this. It is okay for this test to be a few weeks out. There are medication changes we can make to strengthen his heart.  Recommend STOP Losartan and START Entresto 24-26mg  twice daily. Rx already sent to pharmacy.   Recommend repeat BMP 1 week after starting (please place order).   I sent MyChart message with details. His daughter assists to check MyChart as well. Can we please call him simply to review the change and lab recommendations?  Thank you!! [name], NP

## 2020-05-20 NOTE — Telephone Encounter (Signed)
Call to patient to review advice from Gillian Shields, NP.    Pt verbalized understanding and is agreeable to POC.    Advised pt to call for any further questions or concerns.  Orders placed as advised.

## 2020-05-20 NOTE — Addendum Note (Signed)
Addended by: Efrain Sella on: 05/20/2020 03:30 PM   Modules accepted: Orders

## 2020-05-27 ENCOUNTER — Other Ambulatory Visit
Admission: RE | Admit: 2020-05-27 | Discharge: 2020-05-27 | Disposition: A | Discharge status: Home / Self Care | Payer: Medicare HMO | Attending: Family | Admitting: Family

## 2020-05-27 ENCOUNTER — Telehealth: Payer: Self-pay | Admitting: Cardiovascular Disease

## 2020-05-27 ENCOUNTER — Ambulatory Visit: Payer: Medicare HMO | Admitting: Family

## 2020-05-27 DIAGNOSIS — I5020 Unspecified systolic (congestive) heart failure: Secondary | ICD-10-CM

## 2020-05-27 LAB — COMPREHENSIVE METABOLIC PANEL
ALT: 18 U/L (ref 0–44)
AST: 27 U/L (ref 15–41)
Albumin: 4.5 g/dL (ref 3.5–5.0)
Alkaline Phosphatase: 64 U/L (ref 38–126)
Anion gap: 12 (ref 5–15)
BUN: 26 mg/dL — ABNORMAL HIGH (ref 8–23)
CO2: 27 mmol/L (ref 22–32)
Calcium: 9.4 mg/dL (ref 8.9–10.3)
Chloride: 100 mmol/L (ref 98–111)
Creatinine, Ser: 1.5 mg/dL — ABNORMAL HIGH (ref 0.61–1.24)
GFR calc Af Amer: 54 mL/min — ABNORMAL LOW (ref >60)
GFR calc non Af Amer: 46 mL/min — ABNORMAL LOW (ref >60)
Glucose, Bld: 118 mg/dL — ABNORMAL HIGH (ref 70–99)
Potassium: 4.2 mmol/L (ref 3.5–5.1)
Sodium: 139 mmol/L (ref 135–145)
Total Bilirubin: 0.9 mg/dL (ref 0.3–1.2)
Total Protein: 7.6 g/dL (ref 6.5–8.1)

## 2020-05-27 MED ORDER — TORSEMIDE 20 MG PO TABS
20.0000 mg | ORAL_TABLET | Freq: Every day | ORAL | 6 refills | Status: DC
Start: 2020-05-27 — End: 2021-01-11

## 2020-05-27 NOTE — Telephone Encounter (Signed)
Reviewed the patient's chart.  He had a CMET that was done at the hospital today. This was ordered under Rennie Plowman, NP

## 2020-05-27 NOTE — Telephone Encounter (Signed)
Results and recommendations called to patient. He verbalized understanding to continue Entresto and decrease torsemide to 20 mg daily.  He was very appreciative of all our help.

## 2020-05-27 NOTE — Telephone Encounter (Signed)
Patient spouse calling  Wanted to make office aware that lab work was completed that C Dan Humphreys wanted to see  Please review and call if needed

## 2020-05-27 NOTE — Telephone Encounter (Signed)
Reviewed lab work.   His potassium was normal. His kidney function showed mild decline compared to previous. However, it is still acceptable for his upcoming coronary CTA on 06/02/20 and he does not need repeat lab work before that test.  Because his kidney function declined a bit - I would recommend continuing Entresto 24-26mg  twice daily and reducing his Torsemide to 20mg  (one tablet) daily. Oftentimes as we get on the Entresto we do not need as much fluid pill. Please also ensure that he has stopped his Losartan.   I have CC'd , NP so she is aware.   Best, Rennie Plowman, NP

## 2020-05-31 ENCOUNTER — Telehealth (HOSPITAL_COMMUNITY): Payer: Self-pay | Admitting: *Deleted

## 2020-05-31 NOTE — Telephone Encounter (Signed)

## 2020-05-31 NOTE — Telephone Encounter (Signed)
Noted Will monitor renal function

## 2020-06-02 ENCOUNTER — Other Ambulatory Visit: Payer: Self-pay

## 2020-06-02 ENCOUNTER — Ambulatory Visit
Admission: RE | Admit: 2020-06-02 | Discharge: 2020-06-02 | Disposition: A | Discharge status: Home / Self Care | Payer: Medicare HMO | Source: Ambulatory Visit | Attending: Family | Admitting: Family

## 2020-06-02 DIAGNOSIS — I5020 Unspecified systolic (congestive) heart failure: Secondary | ICD-10-CM | POA: Diagnosis not present

## 2020-06-02 MED ORDER — NITROGLYCERIN 0.4 MG SL SUBL
0.8000 mg | SUBLINGUAL_TABLET | Freq: Once | SUBLINGUAL | Status: AC
  Administered 2020-06-02: 0.8 mg via SUBLINGUAL

## 2020-06-02 MED ORDER — IOHEXOL 350 MG/ML SOLN
90.0000 mL | Freq: Once | INTRAVENOUS | Status: AC | PRN
  Administered 2020-06-02: 90 mL via INTRAVENOUS

## 2020-06-02 NOTE — Progress Notes (Addendum)
Patient with irregular HR high 30-65. Unable to determine if patient is in a-fib on monitor due to poor tracing. Dr. Myriam Forehand notified. EKG strip obtained with poor tracing. Able to verify P waves - irregular HR remains. Test cancelled and will be rescheduled at [facility]. Patient notified.  IV removed with cath intact. Patient sitting up in chair, repeat EKG strip, tracing improved. 1st degree block noted, regularly irregular(question 2nd degree block however,unable to visualize a P wave prior to missed beat). Scan image for Dr. Myriam Forehand. Patient tolerated all well. Disappointed we couldn't complete scan. HR remains unchanged. All questions answered, no needs. Ambulate w/o difficulty. Wife at side.

## 2020-06-03 ENCOUNTER — Encounter: Payer: Self-pay | Admitting: Family

## 2020-06-03 ENCOUNTER — Other Ambulatory Visit: Payer: Self-pay

## 2020-06-03 ENCOUNTER — Ambulatory Visit (INDEPENDENT_AMBULATORY_CARE_PROVIDER_SITE_OTHER): Payer: Medicare HMO | Admitting: Family

## 2020-06-03 VITALS — BP 134/54 | HR 66 | Temp 98.1°F | Ht 75.98 in | Wt 321.8 lb

## 2020-06-03 DIAGNOSIS — R69 Illness, unspecified: Secondary | ICD-10-CM | POA: Diagnosis not present

## 2020-06-03 DIAGNOSIS — I5020 Unspecified systolic (congestive) heart failure: Secondary | ICD-10-CM | POA: Diagnosis not present

## 2020-06-03 DIAGNOSIS — F419 Anxiety disorder, unspecified: Secondary | ICD-10-CM | POA: Diagnosis not present

## 2020-06-03 DIAGNOSIS — R7989 Other specified abnormal findings of blood chemistry: Secondary | ICD-10-CM | POA: Diagnosis not present

## 2020-06-03 NOTE — Patient Instructions (Addendum)
Referral to urology, Dr Vanna Scotland  Let us know if you dont hear back within a week in regards to an appointment being scheduled.   Nice to see you!

## 2020-06-03 NOTE — Progress Notes (Signed)
Subjective:    Patient ID: Samuel Gallegos, male    DOB: 03/06/1951, 69 y.o.   MRN: [id number]  CC: Samuel Gallegos is a 69 y.o. male who presents today for follow up.   HPI: Feels well today No complaints. Continues to work and describes feeling so well that he 'hasnt missed a day of work. ' NO CP. Walking the entire parking lot at [facility] and felt well, had SOB.   Compliant with demadex, entresto  Anxiety- doing well on paxil.   H/o low testosterone. Would consider urology consult if male urologist.   Will never have colonoscopy. Declines screen for colon cancer  Following with Dr Mariah Milling HF echo shows 20-25% HISTORY:  History reviewed. No pertinent past medical history. Past Surgical History:  Procedure Laterality Date  . HERNIA REPAIR  1953   Family History  Problem Relation Age of Onset  . Cancer Mother   . Hypertension Father     Allergies: Amoxicillin Current Outpatient Medications on File Prior to Visit  Medication Sig Dispense Refill  . metoprolol tartrate (LOPRESSOR) 100 MG tablet Take 1 tablet (100mg ) by mouth 2 hours prior to Cardiac CT 1 tablet 0  . PARoxetine (PAXIL) 20 MG tablet Take 1 tablet (20 mg total) by mouth daily. 90 tablet 3  . rosuvastatin (CRESTOR) 5 MG tablet Take 1 tablet (5 mg total) by mouth daily. 90 tablet 3  . sacubitril-valsartan (ENTRESTO) 24-26 MG Take 1 tablet by mouth 2 (two) times daily. 60 tablet 2  . torsemide (DEMADEX) 20 MG tablet Take 1 tablet (20 mg total) by mouth daily. 60 tablet 6   No current facility-administered medications on file prior to visit.    Social History   Tobacco Use  . Smoking status: Never Smoker  . Smokeless tobacco: Never Used  Vaping Use  . Vaping Use: Never used  Substance Use Topics  . Alcohol use: Never  . Drug use: Never    Review of Systems  Constitutional: Negative for chills and fever.  Respiratory: Negative for cough and shortness of breath.   Cardiovascular: Negative for chest  pain, palpitations and leg swelling.  Gastrointestinal: Negative for nausea and vomiting.      Objective:    BP (!) 134/54 (BP Location: Left Arm, Patient Position: Sitting)   Pulse 66   Temp 98.1 F (36.7 C)   Ht 6' 3.98" (1.93 m)   Wt (!) 321 lb 12.8 oz (146 kg)   SpO2 96%   BMI 39.19 kg/m  BP Readings from Last 3 Encounters:  06/03/20 (!) 134/54  06/02/20 (!) 119/40  06/02/20 (!) 137/46   Wt Readings from Last 3 Encounters:  06/03/20 (!) 321 lb 12.8 oz (146 kg)  04/11/20 (!) 327 lb 4 oz (148.4 kg)  02/23/20 (!) 321 lb 12.8 oz (146 kg)    Physical Exam Vitals reviewed.  Constitutional:      Appearance: He is well-developed.  Cardiovascular:     Rate and Rhythm: Regular rhythm.     Heart sounds: Normal heart sounds.  Pulmonary:     Effort: Pulmonary effort is normal. No respiratory distress.     Breath sounds: Normal breath sounds. No wheezing, rhonchi or rales.  Musculoskeletal:     Right lower leg: No edema.     Left lower leg: No edema.  Skin:    General: Skin is warm and dry.  Neurological:     Mental Status: He is alert.  Psychiatric:  Speech: Speech normal.        Behavior: Behavior normal.        Assessment & Plan:   Problem List Items Addressed This Visit      Cardiovascular and Mediastinum   HFrEF (heart failure with reduced ejection fraction) (HCC)    No signs or symptoms of fluid volume overload today. Tolerating regimen well. Continues to follow with cardiology; will follow        Other   Anxiety    Doing well on paxil, will continue      Low testosterone in male - Primary   Relevant Orders   Ambulatory referral to Urology       I am having Donovon Modesto maintain his PARoxetine, rosuvastatin, metoprolol tartrate, Entresto, and torsemide.   No orders of the defined types were placed in this encounter.   Return precautions given.   Risks, benefits, and alternatives of the medications and treatment plan prescribed  today were discussed, and patient expressed understanding.   Education regarding symptom management and diagnosis given to patient on AVS.  Continue to follow with Allegra Grana, FNP for routine health maintenance.   [employer] and I agreed with plan.   Rennie Plowman, FNP

## 2020-06-07 ENCOUNTER — Other Ambulatory Visit: Payer: Self-pay

## 2020-06-07 DIAGNOSIS — I5020 Unspecified systolic (congestive) heart failure: Secondary | ICD-10-CM

## 2020-06-07 MED ORDER — METOPROLOL TARTRATE 100 MG PO TABS
ORAL_TABLET | ORAL | 0 refills | Status: DC
Start: 2020-06-07 — End: 2020-08-08

## 2020-06-08 ENCOUNTER — Ambulatory Visit: Payer: Medicare HMO | Admitting: Urology

## 2020-06-08 NOTE — Assessment & Plan Note (Signed)
Doing well on paxil, will continue.  

## 2020-06-08 NOTE — Assessment & Plan Note (Signed)
No signs or symptoms of fluid volume overload today. Tolerating regimen well. Continues to follow with cardiology; will follow

## 2020-06-17 ENCOUNTER — Telehealth (HOSPITAL_COMMUNITY): Payer: Self-pay | Admitting: Emergency Medicine

## 2020-06-17 NOTE — Telephone Encounter (Signed)
Reaching out to patient to offer assistance regarding upcoming cardiac imaging study; pt verbalizes understanding of appt date/time, parking situation and where to check in, pre-test NPO status and medications ordered, and verified current allergies; name and call back number provided for further questions should they arise Dontee Jaso RN Navigator Cardiac Imaging [facility] [phone] office [phone] cell 

## 2020-06-21 ENCOUNTER — Ambulatory Visit (HOSPITAL_COMMUNITY)
Admission: RE | Admit: 2020-06-21 | Discharge: 2020-06-21 | Disposition: A | Discharge status: Home / Self Care | Payer: Medicare HMO | Source: Ambulatory Visit | Attending: Family | Admitting: Family

## 2020-06-21 ENCOUNTER — Other Ambulatory Visit: Payer: Self-pay

## 2020-06-21 DIAGNOSIS — I5020 Unspecified systolic (congestive) heart failure: Secondary | ICD-10-CM | POA: Insufficient documentation

## 2020-06-21 MED ORDER — IOHEXOL 350 MG/ML SOLN
90.0000 mL | Freq: Once | INTRAVENOUS | Status: AC | PRN
  Administered 2020-06-21: 90 mL via INTRAVENOUS

## 2020-06-21 MED ORDER — NITROGLYCERIN 0.4 MG SL SUBL
0.8000 mg | SUBLINGUAL_TABLET | Freq: Once | SUBLINGUAL | Status: AC
  Administered 2020-06-21: 0.8 mg via SUBLINGUAL

## 2020-06-21 MED ORDER — NITROGLYCERIN 0.4 MG SL SUBL
SUBLINGUAL_TABLET | SUBLINGUAL | Status: AC
  Filled 2020-06-21: qty 2

## 2020-06-22 ENCOUNTER — Telehealth: Payer: Self-pay

## 2020-06-22 MED ORDER — ASPIRIN EC 81 MG PO TBEC: 81.0000 mg | DELAYED_RELEASE_TABLET | Freq: Every day | ORAL | 3 refills | Status: AC

## 2020-06-22 NOTE — Telephone Encounter (Signed)
I spoke with patient and gave him CT results. He will start ASA 81 ng daily. He will keep apt with Dr.Gollan on 08/08/20, PCP copied results.

## 2020-06-22 NOTE — Telephone Encounter (Signed)
-----   Message from Samuel Sorrow, NP sent at 06/22/2020  7:45 AM EDT ----- Minimal to mild nonobstructive coronary disease. This means there is mild to moderate plaque in the heart vessels, but it is not causing significant blockage. This means his reduced heart pumping function is not caused by a significant blockage which is a great result! We will prevent the plaque from growing by continuing his Rosuvastatin (Crestor) and starting Aspirin 81mg  daily. The scan showed elevated pressures in his lungs which his Torsemide will help to reduce.   He has follow up 08/08/20 with Dr. 08/10/20. If he wishes to be seen sooner to discuss results (whichever is his preference!) he can, or we can keep the 10/25 appt.

## 2020-06-24 ENCOUNTER — Ambulatory Visit: Payer: Medicare HMO

## 2020-07-07 ENCOUNTER — Ambulatory Visit (INDEPENDENT_AMBULATORY_CARE_PROVIDER_SITE_OTHER): Payer: Medicare HMO

## 2020-07-07 VITALS — Ht 75.98 in | Wt 321.0 lb

## 2020-07-07 DIAGNOSIS — Z0000 Encounter for general adult medical examination without abnormal findings: Secondary | ICD-10-CM

## 2020-07-07 NOTE — Patient Instructions (Addendum)
Samuel Gallegos , Thank you for taking time to come for your Medicare Wellness Visit. I appreciate your ongoing commitment to your health goals. Please review the following plan we discussed and let me know if I can assist you in the future.   These are the goals we discussed: Goals      Patient Stated   .  Weight (lb) < 260 lb (117.9 kg) (pt-stated)      Eat smaller portions Stay activated and hydrated       This is a list of the screening recommended for you and due dates:  Health Maintenance  Topic Date Due  . Flu Shot  01/12/2021*  . Tetanus Vaccine  07/26/2029  . COVID-19 Vaccine  Completed  .  Hepatitis C: One time screening is recommended by Center for Disease Control  (CDC) for  adults born from [birth year] through 1966.   Completed  . Pneumonia vaccines  Completed  . Colon Cancer Screening  Discontinued  *Topic was postponed. The date shown is not the original due date.    Immunizations Immunization History  Administered Date(s) Administered  . Influenza Inj Mdck Quad Pf 07/16/2018  . Influenza, High Dose Seasonal PF 07/27/2019  . PFIZER SARS-COV-2 Vaccination 11/26/2019, 12/23/2019  . Pneumococcal Conjugate-13 08/06/2015  . Pneumococcal-Unspecified 07/18/2016  . Tdap 07/27/2019  . Zoster 08/01/2013   Keep all routine maintenance appointments.   Follow up 10/03/20 @ 8:30  Advanced directives: not yet completed.   Conditions/risks identified: none new.   Follow up in one year for your annual wellness visit.   Preventive Care 104 Years and Older, Male Preventive care refers to lifestyle choices and visits with your health care provider that can promote health and wellness. What does preventive care include?  A yearly physical exam. This is also called an annual well check.  Dental exams once or twice a year.  Routine eye exams. Ask your health care provider how often you should have your eyes checked.  Personal lifestyle choices, including:  Daily care of your  teeth and gums.  Regular physical activity.  Eating a healthy diet.  Avoiding tobacco and drug use.  Limiting alcohol use.  Practicing safe sex.  Taking low doses of aspirin every day.  Taking vitamin and mineral supplements as recommended by your health care provider. What happens during an annual well check? The services and screenings done by your health care provider during your annual well check will depend on your age, overall health, lifestyle risk factors, and family history of disease. Counseling  Your health care provider may ask you questions about your:  Alcohol use.  Tobacco use.  Drug use.  Emotional well-being.  Home and relationship well-being.  Sexual activity.  Eating habits.  History of falls.  Memory and ability to understand (cognition).  Work and work Astronomer. Screening  You may have the following tests or measurements:  Height, weight, and BMI.  Blood pressure.  Lipid and cholesterol levels. These may be checked every 5 years, or more frequently if you are over 75 years old.  Skin check.  Lung cancer screening. You may have this screening every year starting at age 34 if you have a 30-pack-year history of smoking and currently smoke or have quit within the past 15 years.  Fecal occult blood test (FOBT) of the stool. You may have this test every year starting at age 35.  Flexible sigmoidoscopy or colonoscopy. You may have a sigmoidoscopy every 5 years or a colonoscopy every  10 years starting at age 10.  Prostate cancer screening. Recommendations will vary depending on your family history and other risks.  Hepatitis C blood test.  Hepatitis B blood test.  Sexually transmitted disease (STD) testing.  Diabetes screening. This is done by checking your blood sugar (glucose) after you have not eaten for a while (fasting). You may have this done every 1-3 years.  Abdominal aortic aneurysm (AAA) screening. You may need this if you  are a current or former smoker.  Osteoporosis. You may be screened starting at age 5 if you are at high risk. Talk with your health care provider about your test results, treatment options, and if necessary, the need for more tests. Vaccines  Your health care provider may recommend certain vaccines, such as:  Influenza vaccine. This is recommended every year.  Tetanus, diphtheria, and acellular pertussis (Tdap, Td) vaccine. You may need a Td booster every 10 years.  Zoster vaccine. You may need this after age 23.  Pneumococcal 13-valent conjugate (PCV13) vaccine. One dose is recommended after age 33.  Pneumococcal polysaccharide (PPSV23) vaccine. One dose is recommended after age 59. Talk to your health care provider about which screenings and vaccines you need and how often you need them. This information is not intended to replace advice given to you by your health care provider. Make sure you discuss any questions you have with your health care provider. Document Released: 10/28/2015 Document Revised: 06/20/2016 Document Reviewed: 08/02/2015 Elsevier Interactive Patient Education  2017 [employer].  Fall Prevention in the Home Falls can cause injuries. They can happen to people of all ages. There are many things you can do to make your home safe and to help prevent falls. What can I do on the outside of my home?  Regularly fix the edges of walkways and driveways and fix any cracks.  Remove anything that might make you trip as you walk through a door, such as a raised step or threshold.  Trim any bushes or trees on the path to your home.  Use bright outdoor lighting.  Clear any walking paths of anything that might make someone trip, such as rocks or tools.  Regularly check to see if handrails are loose or broken. Make sure that both sides of any steps have handrails.  Any raised decks and porches should have guardrails on the edges.  Have any leaves, snow, or ice cleared  regularly.  Use sand or salt on walking paths during winter.  Clean up any spills in your garage right away. This includes oil or grease spills. What can I do in the bathroom?  Use night lights.  Install grab bars by the toilet and in the tub and shower. Do not use towel bars as grab bars.  Use non-skid mats or decals in the tub or shower.  If you need to sit down in the shower, use a plastic, non-slip stool.  Keep the floor dry. Clean up any water that spills on the floor as soon as it happens.  Remove soap buildup in the tub or shower regularly.  Attach bath mats securely with double-sided non-slip rug tape.  Do not have throw rugs and other things on the floor that can make you trip. What can I do in the bedroom?  Use night lights.  Make sure that you have a light by your bed that is easy to reach.  Do not use any sheets or blankets that are too big for your bed. They should not  hang down onto the floor.  Have a firm chair that has side arms. You can use this for support while you get dressed.  Do not have throw rugs and other things on the floor that can make you trip. What can I do in the kitchen?  Clean up any spills right away.  Avoid walking on wet floors.  Keep items that you use a lot in easy-to-reach places.  If you need to reach something above you, use a strong step stool that has a grab bar.  Keep electrical cords out of the way.  Do not use floor polish or wax that makes floors slippery. If you must use wax, use non-skid floor wax.  Do not have throw rugs and other things on the floor that can make you trip. What can I do with my stairs?  Do not leave any items on the stairs.  Make sure that there are handrails on both sides of the stairs and use them. Fix handrails that are broken or loose. Make sure that handrails are as long as the stairways.  Check any carpeting to make sure that it is firmly attached to the stairs. Fix any carpet that is loose  or worn.  Avoid having throw rugs at the top or bottom of the stairs. If you do have throw rugs, attach them to the floor with carpet tape.  Make sure that you have a light switch at the top of the stairs and the bottom of the stairs. If you do not have them, ask someone to add them for you. What else can I do to help prevent falls?  Wear shoes that:  Do not have high heels.  Have rubber bottoms.  Are comfortable and fit you well.  Are closed at the toe. Do not wear sandals.  If you use a stepladder:  Make sure that it is fully opened. Do not climb a closed stepladder.  Make sure that both sides of the stepladder are locked into place.  Ask someone to hold it for you, if possible.  Clearly mark and make sure that you can see:  Any grab bars or handrails.  First and last steps.  Where the edge of each step is.  Use tools that help you move around (mobility aids) if they are needed. These include:  Canes.  Walkers.  Scooters.  Crutches.  Turn on the lights when you go into a dark area. Replace any light bulbs as soon as they burn out.  Set up your furniture so you have a clear path. Avoid moving your furniture around.  If any of your floors are uneven, fix them.  If there are any pets around you, be aware of where they are.  Review your medicines with your doctor. Some medicines can make you feel dizzy. This can increase your chance of falling. Ask your doctor what other things that you can do to help prevent falls. This information is not intended to replace advice given to you by your health care provider. Make sure you discuss any questions you have with your health care provider. Document Released: 07/28/2009 Document Revised: 03/08/2016 Document Reviewed: 11/05/2014 Elsevier Interactive Patient Education  2017 [employer].

## 2020-07-07 NOTE — Progress Notes (Addendum)
Subjective:   Lavell Supple is a 69 y.o. male who presents for Medicare Annual/Subsequent preventive examination.  Review of Systems    No ROS.  Medicare Wellness Virtual Visit.    Cardiac Risk Factors include: advanced age (>8men, >60 women);male gender     Objective:    Today's Vitals   07/07/20 1301  Weight: (!) 321 lb (145.6 kg)  Height: 6' 3.98" (1.93 m)   Body mass index is 39.09 kg/m.  Advanced Directives 07/07/2020 06/24/2019  Does Patient Have a Medical Advance Directive? No No  Would patient like information on creating a medical advance directive? No - Patient declined No - Patient declined    Current Medications (verified) Outpatient Encounter Medications as of 07/07/2020  Medication Sig  . aspirin EC 81 MG tablet Take 1 tablet (81 mg total) by mouth daily. Swallow whole.  . metoprolol tartrate (LOPRESSOR) 100 MG tablet Take 1 tablet (100mg ) by mouth 2 hours prior to Cardiac CT  . metoprolol tartrate (LOPRESSOR) 100 MG tablet Take 1 tablet (100mg ) by mouth 2 hours prior to Cardiac CT  . PARoxetine (PAXIL) 20 MG tablet Take 1 tablet (20 mg total) by mouth daily.  . rosuvastatin (CRESTOR) 5 MG tablet Take 1 tablet (5 mg total) by mouth daily.  . sacubitril-valsartan (ENTRESTO) 24-26 MG Take 1 tablet by mouth 2 (two) times daily.  torsemide (DEMADEX) 20 MG tablet Take 1 tablet (20 mg total) by mouth daily.   No facility-administered encounter medications on file as of 07/07/2020.    Allergies (verified) Amoxicillin   History: History reviewed. No pertinent past medical history. Past Surgical History:  Procedure Laterality Date  . HERNIA REPAIR  1953   Family History  Problem Relation Age of Onset  . Cancer Mother   . Hypertension Father    Social History   Socioeconomic History  . Marital status: Married    Spouse name: Not on file  . Number of children: Not on file  . Years of education: Not on file  . Highest education level: Not on file    Occupational History  . Not on file  Tobacco Use  . Smoking status: Never Smoker  . Smokeless tobacco: Never Used  Vaping Use  . Vaping Use: Never used  Substance and Sexual Activity  . Alcohol use: Never  . Drug use: Never  . Sexual activity: Not on file  Other Topics Concern  . Not on file  Social History Narrative   Marland Kitchen , drives truck as well   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   Married   Social Determinants of Health   Financial Resource Strain: Low Risk   . Difficulty of Paying Living Expenses: Not hard at all  Food Insecurity: No Food Insecurity  . Worried About 07/09/2020 in the Last Year: Never true  . Ran Out of Food in the Last Year: Never true  Transportation Needs: No Transportation Needs  . Lack of Transportation (Medical): No  . Lack of Transportation (Non-Medical): No  Physical Activity:   . Days of Exercise per Week: Not on file  . Minutes of Exercise per Session: Not on file  Stress: No Stress Concern Present  . Feeling of Stress : Not at all  Social Connections: Unknown  . Frequency of Communication with Friends and Family: Not on file  . Frequency of Social Gatherings with Friends and Family: Not on file  . Attends Religious Services: Not on file  . Active Member of Clubs  or Organizations: Not on file  . Attends Banker Meetings: Not on file  . Marital Status: Married    Tobacco Counseling Counseling given: Not Answered   Clinical Intake:  Pre-visit preparation completed: Yes        Diabetes: No  How often do you need to have someone help you when you read instructions, pamphlets, or other written materials from your doctor or pharmacy?: 1 - Never Interpreter Needed?: No      Activities of Daily Living In your present state of health, do you have any difficulty performing the following activities: 07/07/2020  Hearing? N  Vision? N  Difficulty concentrating or making decisions? N  Walking or climbing stairs? N   Dressing or bathing? N  Doing errands, shopping? N  Preparing Food and eating ? N  Using the Toilet? N  In the past six months, have you accidently leaked urine? N  Do you have problems with loss of bowel control? N  Managing your Medications? N  Managing your Finances? N  Housekeeping or managing your Housekeeping? N  Some recent data might be hidden    Patient Care Team: Allegra Grana, FNP as PCP - General (Family Medicine) Antonieta Iba, MD as PCP - Cardiology (Cardiology)  Indicate any recent Medical Services you may have received from other than [facility] providers in the past year (date may be approximate).     Assessment:   This is a routine wellness examination for [name].  I connected with Lyman Bishop today by telephone and verified that I am speaking with the correct person using two identifiers. Location patient: home Location provider: work Persons participating in the virtual visit: patient, Engineer, civil (consulting).    I discussed the limitations, risks, security and privacy concerns of performing an evaluation and management service by telephone and the availability of in person appointments. The patient expressed understanding and verbally consented to this telephonic visit.    Interactive audio and video telecommunications were attempted between this provider and patient, however failed, due to patient having technical difficulties OR patient did not have access to video capability.  We continued and completed visit with audio only.  Some vital signs may be absent or patient reported.   Hearing/Vision screen  Hearing Screening   125Hz  250Hz  500Hz  1000Hz  2000Hz  3000Hz  4000Hz  6000Hz  8000Hz   Right ear:           Left ear:           Comments: Patient is able to hear conversational tones without difficulty. No issues reported.  Vision Screening Comments: Visual acuity not assessed, virtual visit.   Dietary issues and exercise activities discussed: Current Exercise Habits:  Home exercise routine, Intensity: Moderate  Low salt diet Good water intake  Goals      Patient Stated   .  Weight (lb) < 260 lb (117.9 kg) (pt-stated)      Eat smaller portions Stay activated and hydrated      Depression Screen PHQ 2/9 Scores 07/07/2020 06/03/2020 06/24/2019 04/15/2019 03/05/2019  PHQ - 2 Score 0 0 0 0 0  PHQ- 9 Score - - - 0 0    Fall Risk Fall Risk  07/07/2020 06/03/2020 02/18/2020 06/24/2019 03/05/2019  Falls in the past year? 0 0 0 0 0  Number falls in past yr: 0 0 - - 0  Injury with Fall? - 0 - - 0  Follow up Falls evaluation completed Falls evaluation completed Falls evaluation completed - Falls evaluation completed   Handrails  in use when climbing stairs? Yes Home free of loose throw rugs in walkways, pet beds, electrical cords, etc? Yes  Adequate lighting in your home to reduce risk of falls? Yes   ASSISTIVE DEVICES UTILIZED TO PREVENT FALLS:  Life alert? No  Use of a cane, walker or w/c? No  Grab bars in the bathroom? No   Shower chair or bench in shower? No  Elevated toilet seat or a handicapped toilet? No   TIMED UP AND GO: Was the test performed? No . Virtual visit.   Cognitive Function: Patient is alert and oriented x3.  Denies difficulty focusing, making decisions, memory loss.   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 Enjoys reading.    6CIT Screen 06/24/2019  What Year? 0 points  What month? 0 points  What time? 0 points  Count back from 20 0 points  Months in reverse 0 points  Repeat phrase 0 points  Total Score 0    Immunizations Immunization History  Administered Date(s) Administered  . Influenza Inj Mdck Quad Pf 07/16/2018  . Influenza, High Dose Seasonal PF 07/27/2019  . PFIZER SARS-COV-2 Vaccination 11/26/2019, 12/23/2019  . Pneumococcal Conjugate-13 08/06/2015  . Pneumococcal-Unspecified 07/18/2016  . Tdap 07/27/2019  . Zoster 08/01/2013   Health Maintenance Health Maintenance  Topic Date Due  . INFLUENZA VACCINE  01/12/2021  (Originally 05/15/2020)  . TETANUS/TDAP  07/26/2029  . COVID-19 Vaccine  Completed  . Hepatitis C Screening  Completed  . PNA vac Low Risk Adult  Completed  . COLONOSCOPY  Discontinued    Dental Screening: Recommended annual dental exams for proper oral hygiene.  Community Resource Referral / Chronic Care Management: CRR required this visit?  No   CCM required this visit?  No      Plan:   Keep all routine maintenance appointments.   Follow up 10/03/20 @ 8:30  I have personally reviewed and noted the following in the patient's chart:   . Medical and social history . Use of alcohol, tobacco or illicit drugs  . Current medications and supplements . Functional ability and status . Nutritional status . Physical activity . Advanced directives . List of other physicians . Hospitalizations, surgeries, and ER visits in previous 12 months . Vitals . Screenings to include cognitive, depression, and falls . Referrals and appointments  In addition, I have reviewed and discussed with patient certain preventive protocols, quality metrics, and best practice recommendations. A written personalized care plan for preventive services as well as general preventive health recommendations were provided to patient via mychart.     Ashok Pall, LPN   1/61/0960    Agree with plan. Rennie Plowman, NP

## 2020-07-12 DIAGNOSIS — R69 Illness, unspecified: Secondary | ICD-10-CM | POA: Diagnosis not present

## 2020-08-05 DIAGNOSIS — I2510 Atherosclerotic heart disease of native coronary artery without angina pectoris: Secondary | ICD-10-CM | POA: Insufficient documentation

## 2020-08-05 NOTE — Progress Notes (Signed)
Cardiology Office Note  Date:  08/08/2020   ID:  Samuel Gallegos, DOB [birth year], MRN [id number]  PCP:  Allegra Grana, FNP   Chief Complaint  Patient presents with  . OTHER    4 month f/u. Meds reviewed verbally with pt.    HPI:  Samuel Gallegos is a 69 y.o. male with a hx of  anxiety  Obesity Cardiomyopathy ejection fraction 20 to 25% June 2021 Moderate elevated right heart pressures Mild to moderate MR and AI   Initially seen in primary care on 02/18/2020  worsening lower extremity edema over the past several weeks. He indicated that he did not want to do any further invasive procedures.  Feels well, no leg swelling, no SOB Active on  [employer] Has not needed extra torsemide Moderating his fluid intake  EKG personally reviewed by myself on todays visit Shows normal sinus rhythm rate 59 bpm PACs nonspecific T wave abnormality V4 through V6 1 and aVL  Echocardiogram April 01, 2020, reviewed with him 1. Left ventricular ejection fraction, by estimation, is 20 to 25%. The  left ventricle has severely decreased function. The left ventricle  demonstrates global hypokinesis. The left ventricular internal cavity size  was severely dilated. Left ventricular  diastolic parameters are consistent with Grade II diastolic dysfunction  (pseudonormalization).  2. Right ventricular systolic function is normal. The right ventricular  size is normal. There is moderately elevated pulmonary artery systolic  pressure. The estimated right ventricular systolic pressure is 47.9 mmHg.  3. Left atrial size was moderately dilated.  4. Mild to moderate mitral valve regurgitation.  5.  Aortic valve regurgitation is  mild to moderate.   Cardiac CTA 1. Minimal to mild non-obstructive CAD,   Atherosclerosis primarily of the proximal LAD and RCA arteries. 2. Coronary calcium score of 393. 3. Normal coronary origin with right dominance. 6. Findings consistent with non-ischemic  cardiomyopathy   PMH:   has no past medical history on file.  PSH:    Past Surgical History:  Procedure Laterality Date  . HERNIA REPAIR  1953    Current Outpatient Medications  Medication Sig Dispense Refill  . aspirin EC 81 MG tablet Take 1 tablet (81 mg total) by mouth daily. Swallow whole. 90 tablet 3  . PARoxetine (PAXIL) 20 MG tablet Take 1 tablet (20 mg total) by mouth daily. 90 tablet 3  . rosuvastatin (CRESTOR) 5 MG tablet Take 1 tablet (5 mg total) by mouth daily. 90 tablet 3  . torsemide (DEMADEX) 20 MG tablet Take 1 tablet (20 mg total) by mouth daily. 60 tablet 6  . metoprolol succinate (TOPROL XL) 25 MG 24 hr tablet Take 0.5 tablets (12.5 mg total) by mouth daily. 45 tablet 3  . sacubitril-valsartan (ENTRESTO) 49-51 MG Take 1 tablet by mouth 2 (two) times daily. 60 tablet 11   No current facility-administered medications for this visit.    Allergies:   Amoxicillin   Social History:  The patient  reports that he has never smoked. He has never used smokeless tobacco. He reports that he does not drink alcohol and does not use drugs.   Family History:   family history includes Cancer in his mother; Hypertension in his father.    Review of Systems: Review of Systems  Constitutional: Negative.   HENT: Negative.   Respiratory: Negative.   Cardiovascular: Negative.   Gastrointestinal: Negative.   Musculoskeletal: Negative.   Neurological: Negative.   Psychiatric/Behavioral: Negative.   All other systems reviewed and are negative.  PHYSICAL EXAM: VS:  BP 126/90   Pulse (!) 59   Ht 6\' 5"  (1.956 m)   Wt (!) 327 lb 6 oz (148.5 kg)   SpO2 97%   BMI 38.82 kg/m  , BMI Body mass index is 38.82 kg/m. GEN: Well nourished, well developed, in no acute distress HEENT: normal Neck: no JVD, carotid bruits, or masses Cardiac: RRR; no murmurs, rubs, or gallops,no edema  Respiratory:  clear to auscultation bilaterally, normal work of breathing GI: soft, nontender,  nondisten BMP negative ded, + BS MS: no deformity or atrophy Skin: warm and dry, no rash Neuro:  Strength and sensation are intact Psych: euthymic mood, full affect   Recent Labs: 02/18/2020: TSH 1.63 04/26/2020: Hemoglobin 14.5; Platelets 235 05/27/2020: ALT 18; BUN 26; Creatinine, Ser 1.50; Potassium 4.2; Sodium 139    Lipid Panel Lab Results  Component Value Date   CHOL 153 02/18/2020   HDL 35.40 (L) 02/18/2020   LDLCALC 101 (H) 02/18/2020   TRIG 85.0 02/18/2020      Wt Readings from Last 3 Encounters:  08/08/20 (!) 327 lb 6 oz (148.5 kg)  07/07/20 (!) 321 lb (145.6 kg)  06/03/20 (!) 321 lb 12.8 oz (146 kg)       ASSESSMENT AND PLAN:  Problem List Items Addressed This Visit      Cardiology Problems   CAD (coronary artery disease)   Relevant Medications   metoprolol succinate (TOPROL XL) 25 MG 24 hr tablet   sacubitril-valsartan (ENTRESTO) 49-51 MG   Other Relevant Orders   EKG 12-Lead   Basic metabolic panel   Hepatic function panel   HFrEF (heart failure with reduced ejection fraction) (HCC) - Primary   Relevant Medications   metoprolol succinate (TOPROL XL) 25 MG 24 hr tablet   sacubitril-valsartan (ENTRESTO) 49-51 MG   Other Relevant Orders   EKG 12-Lead   Basic metabolic panel   Hepatic function panel   Mixed hyperlipidemia   Relevant Medications   metoprolol succinate (TOPROL XL) 25 MG 24 hr tablet   sacubitril-valsartan (ENTRESTO) 49-51 MG     Other   Class 2 severe obesity due to excess calories with serious comorbidity and body mass index (BMI) of 39.0 to 39.9 in adult [facility])     Nonischemic cardiomyopathy Etiology unclear, possibly chronic hypertensive heart disease Blood pressure running 150s at home per his report We are discussed recent findings with him including echo and CT Recommended he start metoprolol succinate 12.5 mg daily Suggested after 1 week he then increase the Entresto 49/51 mg p.o. twice daily Close monitoring of blood  pressure and call our office with numbers Repeat BMP today  Acute on chronic renal dysfunction Unclear if this is secondary to prerenal state, repeat BMP today  Coronary calcification Continue Crestor goal LDL less than 70    Total encounter time more than 25 minutes  Greater than 50% was spent in counseling and coordination of care with the patient    Signed, [facility], M.D., Ph.D. [facility] [place], [place]

## 2020-08-06 ENCOUNTER — Other Ambulatory Visit: Payer: Self-pay | Admitting: Family

## 2020-08-06 DIAGNOSIS — I5020 Unspecified systolic (congestive) heart failure: Secondary | ICD-10-CM

## 2020-08-08 ENCOUNTER — Other Ambulatory Visit: Payer: Self-pay

## 2020-08-08 ENCOUNTER — Encounter: Payer: Self-pay | Admitting: Cardiovascular Disease

## 2020-08-08 ENCOUNTER — Ambulatory Visit (INDEPENDENT_AMBULATORY_CARE_PROVIDER_SITE_OTHER): Payer: Medicare HMO | Admitting: Cardiovascular Disease

## 2020-08-08 VITALS — BP 126/90 | HR 59 | Ht 77.0 in | Wt 327.4 lb

## 2020-08-08 DIAGNOSIS — E782 Mixed hyperlipidemia: Secondary | ICD-10-CM | POA: Diagnosis not present

## 2020-08-08 DIAGNOSIS — I5020 Unspecified systolic (congestive) heart failure: Secondary | ICD-10-CM

## 2020-08-08 DIAGNOSIS — I25118 Atherosclerotic heart disease of native coronary artery with other forms of angina pectoris: Secondary | ICD-10-CM

## 2020-08-08 DIAGNOSIS — Z6839 Body mass index (BMI) 39.0-39.9, adult: Secondary | ICD-10-CM | POA: Diagnosis not present

## 2020-08-08 MED ORDER — SACUBITRIL-VALSARTAN 49-51 MG PO TABS: 1.0000 | ORAL_TABLET | Freq: Two times a day (BID) | ORAL | 11 refills | Status: DC

## 2020-08-08 MED ORDER — METOPROLOL SUCCINATE ER 25 MG PO TB24: 12.5000 mg | ORAL_TABLET | Freq: Every day | ORAL | 3 refills | Status: DC

## 2020-08-08 NOTE — Telephone Encounter (Signed)
Pt has visit with TG this am please pend for refill.

## 2020-08-08 NOTE — Patient Instructions (Addendum)
Make sure to check Blood Pressures 2 hours after your medications.   Please keep a log of your readings so we can see how they trend.  Avoid these things for 30 minutes before checking your blood pressure:  Drinking caffeine.  Drinking alcohol.  Eating.  Smoking.  Exercising.  Five minutes before checking your blood pressure:  Pee.  Sit in a dining chair. Avoid sitting in a soft couch or armchair.  Be quiet. Do not talk.  Please call with blood pressure measurements 4x a week starting in 2-3 weeks We need to make sure this runs low given the heart condition High blood pressure can weaken the heart pump    Medication Instructions:  Please start metoprolol succinate 12.5 mg daily (you may need to break a 25 mg in 1/2 daily)  Wait one week, then : Please increase the entresto up to 49/51 mg twice a day  If you need a refill on your cardiac medications before your next appointment, please call your pharmacy.   Lab work: [employer] & LFT today   If you have labs (blood work) drawn today and your tests are completely normal, you will receive your results only by: Marland Kitchen MyChart Message (if you have MyChart) OR . A paper copy in the mail If you have any lab test that is abnormal or we need to change your treatment, we will call you to review the results.   Testing/Procedures: No new testing needed   Follow-Up: At [facility], you and your health needs are our priority.  As part of our continuing mission to provide you with exceptional heart care, we have created designated Provider Care Teams.  These Care Teams include your primary Cardiologist (physician) and Advanced Practice Providers (APPs -  Physician Assistants and Nurse Practitioners) who all work together to provide you with the care you need, when you need it.  . You will need a follow up appointment in 3 months  . Providers on your designated Care Team:   . Nicolasa Ducking, NP . Eula Listen, PA-C . Marisue Ivan, PA-C  Any Other Special Instructions Will Be Listed Below (If Applicable).  COVID-19 Vaccine Information can be found at: [email or web address] For questions related to vaccine distribution or appointments, please email [email or web address] or call [phone].

## 2020-08-09 LAB — BASIC METABOLIC PANEL
BUN/Creatinine Ratio: 20 (ref 10–24)
BUN: 23 mg/dL (ref 8–27)
CO2: 24 mmol/L (ref 20–29)
Calcium: 8.9 mg/dL (ref 8.6–10.2)
Chloride: 103 mmol/L (ref 96–106)
Creatinine, Ser: 1.13 mg/dL (ref 0.76–1.27)
GFR calc Af Amer: 76 mL/min/{1.73_m2} (ref >59)
GFR calc non Af Amer: 65 mL/min/{1.73_m2} (ref >59)
Glucose: 103 mg/dL — ABNORMAL HIGH (ref 65–99)
Potassium: 4.9 mmol/L (ref 3.5–5.2)
Sodium: 141 mmol/L (ref 134–144)

## 2020-08-09 LAB — HEPATIC FUNCTION PANEL
ALT: 11 IU/L (ref 0–44)
AST: 12 IU/L (ref 0–40)
Albumin: 4.5 g/dL (ref 3.8–4.8)
Alkaline Phosphatase: 73 IU/L (ref 44–121)
Bilirubin Total: 0.5 mg/dL (ref 0.0–1.2)
Bilirubin, Direct: 0.11 mg/dL (ref 0.00–0.40)
Total Protein: 6.6 g/dL (ref 6.0–8.5)

## 2020-08-23 DIAGNOSIS — K0263 Dental caries on smooth surface penetrating into pulp: Secondary | ICD-10-CM | POA: Diagnosis not present

## 2020-09-01 ENCOUNTER — Other Ambulatory Visit: Payer: Self-pay | Admitting: Family

## 2020-09-01 DIAGNOSIS — I5020 Unspecified systolic (congestive) heart failure: Secondary | ICD-10-CM

## 2020-10-03 ENCOUNTER — Ambulatory Visit (INDEPENDENT_AMBULATORY_CARE_PROVIDER_SITE_OTHER): Payer: Medicare HMO | Admitting: Family

## 2020-10-03 ENCOUNTER — Other Ambulatory Visit: Payer: Self-pay

## 2020-10-03 ENCOUNTER — Encounter: Payer: Self-pay | Admitting: Family

## 2020-10-03 VITALS — BP 124/60 | HR 56 | Temp 97.7°F | Ht 77.0 in | Wt 327.4 lb

## 2020-10-03 DIAGNOSIS — R7989 Other specified abnormal findings of blood chemistry: Secondary | ICD-10-CM | POA: Diagnosis not present

## 2020-10-03 DIAGNOSIS — I5020 Unspecified systolic (congestive) heart failure: Secondary | ICD-10-CM | POA: Diagnosis not present

## 2020-10-03 DIAGNOSIS — F419 Anxiety disorder, unspecified: Secondary | ICD-10-CM | POA: Diagnosis not present

## 2020-10-03 DIAGNOSIS — E782 Mixed hyperlipidemia: Secondary | ICD-10-CM | POA: Diagnosis not present

## 2020-10-03 DIAGNOSIS — R69 Illness, unspecified: Secondary | ICD-10-CM | POA: Diagnosis not present

## 2020-10-03 LAB — COMPREHENSIVE METABOLIC PANEL
ALT: 9 U/L (ref 0–53)
AST: 11 U/L (ref 0–37)
Albumin: 4.2 g/dL (ref 3.5–5.2)
Alkaline Phosphatase: 60 U/L (ref 39–117)
BUN: 39 mg/dL — ABNORMAL HIGH (ref 6–23)
CO2: 30 mEq/L (ref 19–32)
Calcium: 9.2 mg/dL (ref 8.4–10.5)
Chloride: 101 mEq/L (ref 96–112)
Creatinine, Ser: 1.35 mg/dL (ref 0.40–1.50)
GFR: 53.07 mL/min — ABNORMAL LOW (ref >60.00)
Glucose, Bld: 91 mg/dL (ref 70–99)
Potassium: 5.1 mEq/L (ref 3.5–5.1)
Sodium: 138 mEq/L (ref 135–145)
Total Bilirubin: 0.7 mg/dL (ref 0.2–1.2)
Total Protein: 6.7 g/dL (ref 6.0–8.3)

## 2020-10-03 LAB — LIPID PANEL
Cholesterol: 146 mg/dL (ref 0–200)
HDL: 36.5 mg/dL — ABNORMAL LOW (ref >39.00)
LDL Cholesterol: 92 mg/dL (ref 0–99)
NonHDL: 109.77
Total CHOL/HDL Ratio: 4
Triglycerides: 89 mg/dL (ref 0.0–149.0)
VLDL: 17.8 mg/dL (ref 0.0–40.0)

## 2020-10-03 NOTE — Assessment & Plan Note (Signed)
Anticipate improved. Goal LDL < 100. Continue crestor 5mg . Pending lipid panel.

## 2020-10-03 NOTE — Progress Notes (Signed)
Subjective:    Patient ID: Samuel Gallegos, male    DOB: [birth year], 69 y.o.   MRN: [id number]  CC: Samuel Gallegos is a 69 y.o. male who presents today for follow up.   HPI: Feels well today No complaints.  His exercise tolerance has increased and he feels 'so much better' than one year ago. He is able to work in yard without fatigue, sob, cp. Leg swelling resolved. Saw Dr Mariah Milling in October for HFfEF, nonischemic cardiomyopathy Started on metoprolol succinate 12.5mg  and increased entresto 49/51 to BID.   Anxiety well controlled on paxil. Sleeping well.  Crt 1.13 LDL 101 7 months ago   Never had colonoscopy Would like testosterone checked as has been low in the past. He recently canceled appointment with Dr Apolinar Junes        HISTORY:  History reviewed. No pertinent past medical history. Past Surgical History:  Procedure Laterality Date  . HERNIA REPAIR  1953   Family History  Problem Relation Age of Onset  . Cancer Mother   . Hypertension Father     Allergies: Amoxicillin Current Outpatient Medications on File Prior to Visit  Medication Sig Dispense Refill  . aspirin EC 81 MG tablet Take 1 tablet (81 mg total) by mouth daily. Swallow whole. 90 tablet 3  . metoprolol succinate (TOPROL XL) 25 MG 24 hr tablet Take 0.5 tablets (12.5 mg total) by mouth daily. 45 tablet 3  . PARoxetine (PAXIL) 20 MG tablet Take 1 tablet (20 mg total) by mouth daily. 90 tablet 3  . rosuvastatin (CRESTOR) 5 MG tablet Take 1 tablet (5 mg total) by mouth daily. 90 tablet 3  . sacubitril-valsartan (ENTRESTO) 49-51 MG Take 1 tablet by mouth 2 (two) times daily. 60 tablet 11  . torsemide (DEMADEX) 20 MG tablet Take 1 tablet (20 mg total) by mouth daily. 60 tablet 6   No current facility-administered medications on file prior to visit.    Social History   Tobacco Use  . Smoking status: Never Smoker  . Smokeless tobacco: Never Used  Vaping Use  . Vaping Use: Never used  Substance Use Topics   . Alcohol use: Never  . Drug use: Never    Review of Systems  Constitutional: Negative for chills and fever.  Respiratory: Negative for cough and shortness of breath.   Cardiovascular: Negative for chest pain, palpitations and leg swelling.  Gastrointestinal: Negative for nausea and vomiting.      Objective:    BP 124/60   Pulse (!) 56   Temp 97.7 F (36.5 C)   Ht 6\' 5"  (1.956 m)   Wt (!) 327 lb 6.4 oz (148.5 kg)   SpO2 97%   BMI 38.82 kg/m  BP Readings from Last 3 Encounters:  10/03/20 124/60  08/08/20 126/90  06/21/20 (!) 123/48   Wt Readings from Last 3 Encounters:  10/03/20 (!) 327 lb 6.4 oz (148.5 kg)  08/08/20 (!) 327 lb 6 oz (148.5 kg)  07/07/20 (!) 321 lb (145.6 kg)    Physical Exam Vitals reviewed.  Constitutional:      Appearance: He is well-developed and well-nourished.  Cardiovascular:     Rate and Rhythm: Regular rhythm.     Heart sounds: Normal heart sounds.  Pulmonary:     Effort: Pulmonary effort is normal. No respiratory distress.     Breath sounds: Normal breath sounds. No wheezing, rhonchi or rales.  Musculoskeletal:     Right lower leg: No edema.     Left lower  leg: No edema.  Skin:    General: Skin is warm and dry.  Neurological:     Mental Status: He is alert.  Psychiatric:        Mood and Affect: Mood and affect normal.        Speech: Speech normal.        Behavior: Behavior normal.        Assessment & Plan:   Problem List Items Addressed This Visit      Cardiovascular and Mediastinum   HFrEF (heart failure with reduced ejection fraction) (HCC)    No evidence of fluid volume overload. Following with Dr Mariah Milling, will follow.       Relevant Orders   Comprehensive metabolic panel   Lipid panel     Other   Anxiety    Stable, continue paxil 20mg       Low testosterone in male - Primary   Relevant Orders   Testosterone , Free and Total   Mixed hyperlipidemia    Anticipate improved. Goal LDL < 100. Continue crestor 5mg .  Pending lipid panel.         Declines colonoscopy or Cologuard.   I am having Suraj Hanners maintain his PARoxetine, rosuvastatin, torsemide, aspirin EC, metoprolol succinate, and sacubitril-valsartan.   No orders of the defined types were placed in this encounter.   Return precautions given.   Risks, benefits, and alternatives of the medications and treatment plan prescribed today were discussed, and patient expressed understanding.   Education regarding symptom management and diagnosis given to patient on AVS.  Continue to follow with , FNP for routine health maintenance.   and I agreed with plan.   Allegra Grana, FNP

## 2020-10-03 NOTE — Assessment & Plan Note (Signed)
No evidence of fluid volume overload. Following with Dr Mariah Milling, will follow.

## 2020-10-03 NOTE — Assessment & Plan Note (Signed)
Stable, continue paxil 20mg.  

## 2020-10-03 NOTE — Patient Instructions (Signed)
Nice to see you!   

## 2020-10-04 ENCOUNTER — Telehealth: Payer: Self-pay | Admitting: Family

## 2020-10-04 NOTE — Telephone Encounter (Signed)
Pt called wanting to get his lab result from yesterday

## 2020-10-04 NOTE — Telephone Encounter (Signed)
These have not yet been resulted to me & I know patient worries. Please advise?

## 2020-10-06 LAB — TESTOSTERONE, FREE & TOTAL
Free Testosterone: 30.1 pg/mL (ref 30.0–135.0)
Testosterone, Total, LC-MS-MS: 198 ng/dL — ABNORMAL LOW (ref 250–1100)

## 2020-10-10 ENCOUNTER — Other Ambulatory Visit: Payer: Self-pay | Admitting: Family

## 2020-10-10 DIAGNOSIS — R7989 Other specified abnormal findings of blood chemistry: Secondary | ICD-10-CM

## 2020-10-10 NOTE — Telephone Encounter (Signed)
Resulted via mychart Call pt and ensure he got

## 2020-10-26 ENCOUNTER — Encounter: Payer: Self-pay | Admitting: Urology

## 2020-10-26 ENCOUNTER — Other Ambulatory Visit: Payer: Self-pay

## 2020-10-26 ENCOUNTER — Ambulatory Visit (INDEPENDENT_AMBULATORY_CARE_PROVIDER_SITE_OTHER): Payer: Medicare HMO | Admitting: Urology

## 2020-10-26 VITALS — BP 168/66 | HR 67 | Ht 77.0 in | Wt 328.0 lb

## 2020-10-26 DIAGNOSIS — E291 Testicular hypofunction: Secondary | ICD-10-CM

## 2020-10-26 DIAGNOSIS — N5201 Erectile dysfunction due to arterial insufficiency: Secondary | ICD-10-CM

## 2020-10-26 NOTE — Progress Notes (Signed)
10/26/2020 9:40 AM   [name] 12-23-1950 [id number]  Referring provider: Allegra Grana, FNP [address] [place],  [place] [address]  Chief Complaint  Patient presents with  . Hypogonadism    HPI: Samuel Gallegos is a 70 y.o. male referred for evaluation of hypogonadism.   History of hypogonadism and at recent visit requested a testosterone level which was low at 198 ng/dL  Free testosterone level low normal at 30.1 pg/mL  He works regularly and denies tiredness, fatigue  He does have ED and erections from for penetration  Organic risk factors include coronary artery disease and beta-blockers  PSA July 2021 0.68   PMH: History reviewed. No pertinent past medical history.  Surgical History: Past Surgical History:  Procedure Laterality Date  . HERNIA REPAIR  1953    Home Medications:  Allergies as of 10/26/2020      Reactions   Amoxicillin Itching      Medication List       Accurate as of October 26, 2020  9:40 AM. If you have any questions, ask your nurse or doctor.        aspirin EC 81 MG tablet Take 1 tablet (81 mg total) by mouth daily. Swallow whole.   metoprolol succinate 25 MG 24 hr tablet Commonly known as: Toprol XL Take 0.5 tablets (12.5 mg total) by mouth daily.   PARoxetine 20 MG tablet Commonly known as: PAXIL Take 1 tablet (20 mg total) by mouth daily.   rosuvastatin 5 MG tablet Commonly known as: Crestor Take 1 tablet (5 mg total) by mouth daily.   sacubitril-valsartan 49-51 MG Commonly known as: ENTRESTO Take 1 tablet by mouth 2 (two) times daily.   torsemide 20 MG tablet Commonly known as: DEMADEX Take 1 tablet (20 mg total) by mouth daily.       Allergies:  Allergies  Allergen Reactions  . Amoxicillin Itching    Family History: Family History  Problem Relation Age of Onset  . Cancer Mother   . Hypertension Father     Social History:  reports that he has never smoked. He has never used  smokeless tobacco. He reports that he does not drink alcohol and does not use drugs.   Physical Exam: BP (!) 168/66   Pulse 67   Ht 6\' 5"  (1.956 m)   Wt (!) 328 lb (148.8 kg)   BMI 38.90 kg/m   Constitutional:  Alert and oriented, No acute distress. HEENT: [place] AT, moist mucus membranes.  Trachea midline, no masses. Cardiovascular: No clubbing, cyanosis, or edema. Respiratory: Normal respiratory effort, no increased work of breathing. Neurologic: Grossly intact, no focal deficits, moving all 4 extremities. Psychiatric: Normal mood and affect.   Assessment & Plan:    1.  Hypogonadism  No bothersome symptoms  We discussed testosterone replacement therapy and potential side effects including benign prostate growth, erythrocytosis  We also discussed some studies suggesting increased rates of MI/CVA with testosterone replacement therapy though the studies are controversial  At this point he does not desire replacement since he has no bothersome symptoms  He will have his testosterone level checked every 1-2 years and would consider replacement if level <100  He will return should he develop symptoms  2.  Erectile dysfunction  Discussed it is unlikely TRT would significantly improve his erections and his most likely etiology is vascular secondary to arterial insufficiency.  He was not interested in a PDE 5 inhibitor trial   , MD  [facility] [address] [place], [place] [address] [phone]

## 2020-11-07 NOTE — Progress Notes (Unsigned)
Cardiology Office Note  Date:  11/08/2020   ID:  Samuel Gallegos, DOB 01/27/1951, MRN [id number]  PCP:  Samuel Grana, FNP   Chief Complaint  Patient presents with  . Follow-up    3 Months follow up. Medications verbally reviewed with patient.     HPI:  Samuel Gallegos is a 70 y.o. male with a hx of  anxiety  Obesity Cardiomyopathy ejection fraction 20 to 25% June 2021 Moderate elevated right heart pressures Mild to moderate MR and AI  Who presents for routine follow-up of his cardiomyopathy  Initially seen in primary care on 02/18/2020  worsening lower extremity edema over the past several weeks.  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 Left ventricular ejection fraction, by estimation, is 20 to 25%. The  left ventricle has severely decreased function. The left ventricle  demonstrates global hypokinesis. The left ventricular internal cavity size  was severely dilated. Left ventricular  diastolic parameters are consistent with Grade II diastolic dysfunction  (pseudonormalization).  2. Right ventricular systolic function is normal. The right ventricular  size is normal. There is moderately elevated pulmonary artery systolic  pressure. The estimated right ventricular systolic pressure is 47.9 mmHg.  3. Left atrial size was moderately dilated.  4. Mild to moderate mitral valve regurgitation.  5.  Aortic valve regurgitation is  mild to moderate.   Cardiac CTA, results reviewed with him again  today 1. Minimal to mild non-obstructive CAD,   Atherosclerosis primarily of the proximal LAD and RCA arteries. 2. Coronary calcium score of 393. 3. Normal coronary origin with right dominance. 6. Findings consistent with non-ischemic cardiomyopathy   PMH:   has no past medical history on file.  PSH:    Past Surgical History:  Procedure Laterality Date  . HERNIA REPAIR  1953    Current Outpatient Medications  Medication Sig Dispense Refill  . aspirin EC 81 MG tablet Take 1 tablet (81 mg total) by mouth daily. Swallow whole. 90 tablet 3  . metoprolol succinate (TOPROL XL) 25 MG 24 hr tablet Take 0.5 tablets (12.5 mg total) by mouth daily. 45 tablet 3  . PARoxetine (PAXIL) 20 MG tablet Take 1 tablet (20 mg total) by mouth daily. 90 tablet 3  . rosuvastatin (CRESTOR) 5 MG tablet Take 1 tablet (5 mg total) by mouth daily. 90 tablet 3  . sacubitril-valsartan (ENTRESTO) 49-51 MG Take 1 tablet by mouth 2 (two) times daily. 60 tablet 11  . torsemide (DEMADEX) 20 MG tablet Take 1 tablet (20 mg total) by mouth daily. 60 tablet 6   No current facility-administered medications for this visit.    Allergies:   Amoxicillin   Social History:  The patient  reports that he has never smoked. He has never used smokeless tobacco. He reports that he does not drink alcohol and does not use drugs.   Family History:   family history includes Cancer in his mother; Hypertension in his father.    Review of Systems: Review of Systems  Constitutional: Negative.   HENT: Negative.   Respiratory:  Negative.   Cardiovascular: Negative.   Gastrointestinal: Negative.   Musculoskeletal: Negative.   Neurological: Negative.   Psychiatric/Behavioral: Negative.   All other systems reviewed and are negative.   PHYSICAL EXAM: VS:  BP (!) 154/60 (BP Location: Left Arm, Patient Position: Sitting, Cuff Size: Normal)   Pulse (!) 58   Ht 6\' 5"  (1.956 m)   Wt (!) 330 lb (149.7 kg)   SpO2 98%   BMI 39.13 kg/m  ,  BMI Body mass index is 39.13 kg/m. Constitutional:  oriented to person, place, and time. No distress.  HENT:  Head: Grossly normal Eyes:  no discharge. No scleral icterus.  Neck: No JVD, no carotid bruits  Cardiovascular: Regular rate and rhythm, no murmurs appreciated Pulmonary/Chest: Clear to auscultation bilaterally, no wheezes or rails Abdominal: Soft.  no distension.  no tenderness.  Musculoskeletal: Normal range of motion Neurological:  normal muscle tone. Coordination normal. No atrophy Skin: Skin warm and dry Psychiatric: normal affect, pleasant  Recent Labs: 02/18/2020: TSH 1.63 04/26/2020: Hemoglobin 14.5; Platelets 235 10/03/2020: ALT 9; BUN 39; Creatinine, Ser 1.35; Potassium 5.1; Sodium 138    Lipid Panel Lab Results  Component Value Date   CHOL 146 10/03/2020   HDL 36.50 (L) 10/03/2020   LDLCALC 92 10/03/2020   TRIG 89.0 10/03/2020      Wt Readings from Last 3 Encounters:  11/08/20 (!) 330 lb (149.7 kg)  10/26/20 (!) 328 lb (148.8 kg)  10/03/20 (!) 327 lb 6.4 oz (148.5 kg)     ASSESSMENT AND PLAN:  Problem List Items Addressed This Visit      Cardiology Problems   Mixed hyperlipidemia   HFrEF (heart failure with reduced ejection fraction) (HCC) - Primary   CAD (coronary artery disease)   Valvular heart disease    Other Visit Diagnoses    Dyspnea on exertion         Nonischemic cardiomyopathy Long discussion concerning etiology of cardiomyopathy, management Concerning for chronic hypertensive heart disease Reports blood pressure continues to run high at home up to 150-160 systolic Echocardiogram and CT scan reviewed with him Continue metoprolol at current dose We will increase Entresto up to 97/103 mg p.o. twice daily BMP in 1 month Also recommended he call 10/05/20 with some blood pressures in the next several weeks Ideally would like to add spironolactone 25 mg daily Goals for aggressive blood pressure management for several more months then repeat  echocardiogram Needs a higher ejection fraction to pass DOT in December 2022  Acute on chronic renal dysfunction Stable renal function, will check again in 1 month's time  Coronary calcification Continue Crestor goal LDL less than 70    Total encounter time more than 35 minutes  Greater than 50% was spent in counseling and coordination of care with the patient    Signed, January 2023, M.D., Ph.D. [facility] [place], [place]

## 2020-11-08 ENCOUNTER — Other Ambulatory Visit: Payer: Self-pay

## 2020-11-08 ENCOUNTER — Encounter: Payer: Self-pay | Admitting: Cardiovascular Disease

## 2020-11-08 ENCOUNTER — Ambulatory Visit (INDEPENDENT_AMBULATORY_CARE_PROVIDER_SITE_OTHER): Payer: Medicare HMO | Admitting: Cardiovascular Disease

## 2020-11-08 VITALS — BP 154/60 | HR 58 | Ht 77.0 in | Wt 330.0 lb

## 2020-11-08 DIAGNOSIS — I25118 Atherosclerotic heart disease of native coronary artery with other forms of angina pectoris: Secondary | ICD-10-CM | POA: Diagnosis not present

## 2020-11-08 DIAGNOSIS — I38 Endocarditis, valve unspecified: Secondary | ICD-10-CM

## 2020-11-08 DIAGNOSIS — R0609 Other forms of dyspnea: Secondary | ICD-10-CM

## 2020-11-08 DIAGNOSIS — R0600 Dyspnea, unspecified: Secondary | ICD-10-CM

## 2020-11-08 DIAGNOSIS — I5020 Unspecified systolic (congestive) heart failure: Secondary | ICD-10-CM | POA: Diagnosis not present

## 2020-11-08 DIAGNOSIS — E782 Mixed hyperlipidemia: Secondary | ICD-10-CM | POA: Diagnosis not present

## 2020-11-08 MED ORDER — ENTRESTO 97-103 MG PO TABS: 1.0000 | ORAL_TABLET | Freq: Two times a day (BID) | ORAL | 3 refills | Status: DC

## 2020-11-08 NOTE — Patient Instructions (Addendum)
Medication Instructions:  Please increase the entresto up to 97/103 mg twice a day Monitor blood pressure, call if elevated   Lab work: CMP in one month Walk into medical mall at the check in desk, they will direct you to lab registration, hours for labs are Monday-Friday 07:00am-5:30pm (no appointment necessary)+   Testing/Procedures: No new testing needed   Follow-Up: At [facility], you and your health needs are our priority.  As part of our continuing mission to provide you with exceptional heart care, we have created designated Provider Care Teams.  These Care Teams include your primary Cardiologist (physician) and Advanced Practice Providers (APPs -  Physician Assistants and Nurse Practitioners) who all work together to provide you with the care you need, when you need it.  . You will need a follow up appointment in 3 months, app ok  . Providers on your designated Care Team:   . Nicolasa Ducking, NP . Eula Listen, PA-C . Marisue Ivan, PA-C  Any Other Special Instructions Will Be Listed Below (If Applicable).  COVID-19 Vaccine Information can be found at: [email or web address] For questions related to vaccine distribution or appointments, please email [email or web address] or call [phone].   Please monitor blood pressures and keep a log of your readings.   How to use a home blood pressure monitor. . Be still. . Measure at the same time every day. It's important to take the readings at the same time each day, such as morning and evening. Take reading approximately 1 1/2 to 2 hours after BP medications.   AVOID these things for 30 minutes before checking your blood pressure:  Drinking caffeine.  Drinking alcohol.  Eating.  Smoking.  Exercising.   Five minutes before checking your blood pressure:  Pee.  Sit in a dining chair. Avoid sitting in a soft couch or armchair.  Be quiet. Do not  talk.       Sit correctly. Sit with your back straight and supported (on a dining chair, rather than a sofa). Your feet should be flat on the floor and your legs should not be crossed. Your arm should be supported on a flat surface (such as a table) with the upper arm at heart level. Make sure the bottom of the cuff is placed directly above the bend of the elbow.

## 2020-11-25 ENCOUNTER — Telehealth: Payer: Self-pay | Admitting: Cardiovascular Disease

## 2020-11-25 NOTE — Telephone Encounter (Signed)
Patient calling in to report BP's since last visit 1/28 112/64 1/31 118/58 2/11 121/57

## 2020-11-28 ENCOUNTER — Other Ambulatory Visit: Payer: Self-pay

## 2020-11-28 MED ORDER — ENTRESTO 97-103 MG PO TABS: 1.0000 | ORAL_TABLET | Freq: Two times a day (BID) | ORAL | 0 refills | Status: DC

## 2020-11-29 ENCOUNTER — Telehealth: Payer: Self-pay | Admitting: *Deleted

## 2020-11-29 ENCOUNTER — Telehealth: Payer: Self-pay

## 2020-11-29 MED ORDER — ENTRESTO 97-103 MG PO TABS: 1.0000 | ORAL_TABLET | Freq: Two times a day (BID) | ORAL | 3 refills | Status: DC

## 2020-11-29 NOTE — Telephone Encounter (Signed)
Received fax from pharmacy stating patient is stating he should be on a higher dose of Entresto. The prescription sent in yesterday was for Entresto  97 mg- 103 mg. Please clarify with pharmacy and patient

## 2020-11-29 NOTE — Telephone Encounter (Signed)
Entresto refill sent in to [facility]. Pt was prescribe Entresto 49-51 mg, but at Jan visit with Dr. Mariah Milling, he increased the Entreso to 97-103 mg. Script sent for 97-103 mg with one year supply.

## 2020-12-03 NOTE — Telephone Encounter (Signed)
That is the higher dose, His prior dose was 1/2 of the current dose Script is correct

## 2020-12-03 NOTE — Telephone Encounter (Signed)
I am unclear now as to what the dose of entresto should be BP was elevated last week in office He also told me the BP was elevated at home, so we increased the entresto to 103/97 BID These home pressure are not elevated, Perhaps stay on the entresto dose he was taking previously 49/51 twice a day unless they run higher

## 2020-12-05 MED ORDER — ENTRESTO 97-103 MG PO TABS: 1.0000 | ORAL_TABLET | Freq: Two times a day (BID) | ORAL | 3 refills | Status: DC

## 2020-12-05 NOTE — Telephone Encounter (Addendum)
Was abel to reach out to Samuel Gallegos regarding his Samuel Gallegos medication and his recent BP readings, Samuel Gallegos stated those BP readings of: 1/28     112/64 1/31     118/58 2/11     121/57 Was on the higher dose of Entresto 97-103 mg during BP assessment. Current BP 129/67, did the higher dose this morning , He has not taken the lower dose of Entresto since last visit. Pt reports he would like to continue the higher dose of Entresto, feels he is doing well and BP is more controled with the new dosing. Pt reports will monitor BP and if BP is reading "low" <100 systolic and develops s/s of dizziness/faintness he will call in for advise and possible dosing change back to original dosing of 49-51 mg. Otherwise, no concerns at this time, will call back for anything further or concerns.

## 2020-12-05 NOTE — Telephone Encounter (Signed)
Pharmacy never received medication, please send again

## 2020-12-05 NOTE — Telephone Encounter (Signed)
Entresto 97-103 mg sent in [facility] to [facility] pharmacy

## 2020-12-05 NOTE — Addendum Note (Signed)
Addended by: Maple Hudson on: 12/05/2020 11:14 AM   Modules accepted: Orders

## 2020-12-09 ENCOUNTER — Other Ambulatory Visit
Admission: RE | Admit: 2020-12-09 | Discharge: 2020-12-09 | Disposition: A | Discharge status: Home / Self Care | Payer: Medicare HMO | Attending: Cardiovascular Disease | Admitting: Cardiovascular Disease

## 2020-12-09 ENCOUNTER — Telehealth: Payer: Self-pay | Admitting: Cardiovascular Disease

## 2020-12-09 ENCOUNTER — Other Ambulatory Visit: Payer: Self-pay

## 2020-12-09 DIAGNOSIS — I25118 Atherosclerotic heart disease of native coronary artery with other forms of angina pectoris: Secondary | ICD-10-CM

## 2020-12-09 DIAGNOSIS — I5020 Unspecified systolic (congestive) heart failure: Secondary | ICD-10-CM

## 2020-12-09 LAB — COMPREHENSIVE METABOLIC PANEL
ALT: 12 U/L (ref 0–44)
AST: 12 U/L — ABNORMAL LOW (ref 15–41)
Albumin: 4.1 g/dL (ref 3.5–5.0)
Alkaline Phosphatase: 61 U/L (ref 38–126)
Anion gap: 9 (ref 5–15)
BUN: 31 mg/dL — ABNORMAL HIGH (ref 8–23)
CO2: 28 mmol/L (ref 22–32)
Calcium: 9.1 mg/dL (ref 8.9–10.3)
Chloride: 100 mmol/L (ref 98–111)
Creatinine, Ser: 1.2 mg/dL (ref 0.61–1.24)
GFR, Estimated: >60 mL/min (ref >60)
Glucose, Bld: 108 mg/dL — ABNORMAL HIGH (ref 70–99)
Potassium: 4.8 mmol/L (ref 3.5–5.1)
Sodium: 137 mmol/L (ref 135–145)
Total Bilirubin: 1 mg/dL (ref 0.3–1.2)
Total Protein: 7.4 g/dL (ref 6.5–8.1)

## 2020-12-09 NOTE — Telephone Encounter (Signed)
Spoke with the patient. He has his lab drawn this morning at the medical mall and he has reviewed the results in mychart. Adv the patient that results sometimes  get released to mychart before the ordering provider has the opportunity to review them and give their recommendation. Adv the patient that once Dr. Mariah Milling has the opportunity to review the the results and give his recommendation he will be contacted with the results.  Patient verbalized understanding and voiced appreciation for the call back.

## 2020-12-09 NOTE — Telephone Encounter (Signed)
Patient calling to discuss recent lab testing results  ° °Please call  ° °

## 2020-12-15 ENCOUNTER — Telehealth: Payer: Self-pay | Admitting: *Deleted

## 2020-12-15 NOTE — Telephone Encounter (Signed)
Reviewed results with patient and he verbalized understanding with no further questions at this time. 

## 2020-12-15 NOTE — Telephone Encounter (Signed)
Left voicemail message to call back for review of results.  

## 2020-12-15 NOTE — Telephone Encounter (Signed)
-----   Message from Antonieta Iba, MD sent at 12/14/2020  4:01 PM EST ----- BMP labs Stable numbers, electrolytes within normal range

## 2020-12-15 NOTE — Telephone Encounter (Signed)
Patient returning call.

## 2021-01-11 ENCOUNTER — Other Ambulatory Visit: Payer: Self-pay | Admitting: *Deleted

## 2021-01-11 MED ORDER — TORSEMIDE 20 MG PO TABS: 20.0000 mg | ORAL_TABLET | Freq: Every day | ORAL | 0 refills | Status: DC

## 2021-01-11 NOTE — Telephone Encounter (Signed)
Patient wife called to clarify  Torsemide 20 MG 1 tablet per day  [facility] [address] [facility]

## 2021-01-13 ENCOUNTER — Emergency Department: Payer: Medicare HMO

## 2021-01-13 ENCOUNTER — Inpatient Hospital Stay
Admission: EM | Admit: 2021-01-13 | Discharge: 2021-01-14 | DRG: 603 | Discharge status: Against Medical Advice | Payer: Medicare HMO | Attending: Family Medicine | Admitting: Family Medicine

## 2021-01-13 ENCOUNTER — Other Ambulatory Visit: Payer: Self-pay

## 2021-01-13 ENCOUNTER — Inpatient Hospital Stay: Payer: Medicare HMO

## 2021-01-13 DIAGNOSIS — I5022 Chronic systolic (congestive) heart failure: Secondary | ICD-10-CM | POA: Diagnosis present

## 2021-01-13 DIAGNOSIS — Z20822 Contact with and (suspected) exposure to covid-19: Secondary | ICD-10-CM | POA: Diagnosis not present

## 2021-01-13 DIAGNOSIS — I25118 Atherosclerotic heart disease of native coronary artery with other forms of angina pectoris: Secondary | ICD-10-CM | POA: Diagnosis not present

## 2021-01-13 DIAGNOSIS — Z79899 Other long term (current) drug therapy: Secondary | ICD-10-CM

## 2021-01-13 DIAGNOSIS — I5020 Unspecified systolic (congestive) heart failure: Secondary | ICD-10-CM | POA: Diagnosis not present

## 2021-01-13 DIAGNOSIS — L03032 Cellulitis of left toe: Secondary | ICD-10-CM | POA: Diagnosis not present

## 2021-01-13 DIAGNOSIS — I110 Hypertensive heart disease with heart failure: Secondary | ICD-10-CM | POA: Diagnosis present

## 2021-01-13 DIAGNOSIS — E782 Mixed hyperlipidemia: Secondary | ICD-10-CM | POA: Diagnosis not present

## 2021-01-13 DIAGNOSIS — L0390 Cellulitis, unspecified: Secondary | ICD-10-CM | POA: Diagnosis not present

## 2021-01-13 DIAGNOSIS — F32A Depression, unspecified: Secondary | ICD-10-CM | POA: Diagnosis present

## 2021-01-13 DIAGNOSIS — E790 Hyperuricemia without signs of inflammatory arthritis and tophaceous disease: Secondary | ICD-10-CM | POA: Diagnosis present

## 2021-01-13 DIAGNOSIS — Z8739 Personal history of other diseases of the musculoskeletal system and connective tissue: Secondary | ICD-10-CM | POA: Diagnosis present

## 2021-01-13 DIAGNOSIS — Z89412 Acquired absence of left great toe: Secondary | ICD-10-CM | POA: Diagnosis not present

## 2021-01-13 DIAGNOSIS — L03116 Cellulitis of left lower limb: Secondary | ICD-10-CM | POA: Diagnosis not present

## 2021-01-13 DIAGNOSIS — R69 Illness, unspecified: Secondary | ICD-10-CM | POA: Diagnosis not present

## 2021-01-13 DIAGNOSIS — Z6838 Body mass index (BMI) 38.0-38.9, adult: Secondary | ICD-10-CM

## 2021-01-13 DIAGNOSIS — E119 Type 2 diabetes mellitus without complications: Secondary | ICD-10-CM | POA: Diagnosis not present

## 2021-01-13 DIAGNOSIS — L089 Local infection of the skin and subcutaneous tissue, unspecified: Secondary | ICD-10-CM | POA: Diagnosis not present

## 2021-01-13 DIAGNOSIS — Z881 Allergy status to other antibiotic agents status: Secondary | ICD-10-CM

## 2021-01-13 DIAGNOSIS — M7989 Other specified soft tissue disorders: Secondary | ICD-10-CM | POA: Diagnosis not present

## 2021-01-13 DIAGNOSIS — I2510 Atherosclerotic heart disease of native coronary artery without angina pectoris: Secondary | ICD-10-CM | POA: Diagnosis present

## 2021-01-13 DIAGNOSIS — Z7982 Long term (current) use of aspirin: Secondary | ICD-10-CM

## 2021-01-13 DIAGNOSIS — M79675 Pain in left toe(s): Secondary | ICD-10-CM | POA: Diagnosis not present

## 2021-01-13 DIAGNOSIS — R0989 Other specified symptoms and signs involving the circulatory and respiratory systems: Secondary | ICD-10-CM | POA: Diagnosis not present

## 2021-01-13 DIAGNOSIS — M10072 Idiopathic gout, left ankle and foot: Secondary | ICD-10-CM | POA: Diagnosis not present

## 2021-01-13 DIAGNOSIS — M869 Osteomyelitis, unspecified: Secondary | ICD-10-CM | POA: Diagnosis not present

## 2021-01-13 DIAGNOSIS — S91102A Unspecified open wound of left great toe without damage to nail, initial encounter: Secondary | ICD-10-CM | POA: Diagnosis not present

## 2021-01-13 LAB — COMPREHENSIVE METABOLIC PANEL
ALT: 10 U/L (ref 0–44)
AST: 15 U/L (ref 15–41)
Albumin: 4.1 g/dL (ref 3.5–5.0)
Alkaline Phosphatase: 61 U/L (ref 38–126)
Anion gap: 9 (ref 5–15)
BUN: 31 mg/dL — ABNORMAL HIGH (ref 8–23)
CO2: 22 mmol/L (ref 22–32)
Calcium: 9.1 mg/dL (ref 8.9–10.3)
Chloride: 105 mmol/L (ref 98–111)
Creatinine, Ser: 1.54 mg/dL — ABNORMAL HIGH (ref 0.61–1.24)
GFR, Estimated: 48 mL/min — ABNORMAL LOW (ref >60)
Glucose, Bld: 130 mg/dL — ABNORMAL HIGH (ref 70–99)
Potassium: 4.5 mmol/L (ref 3.5–5.1)
Sodium: 136 mmol/L (ref 135–145)
Total Bilirubin: 0.9 mg/dL (ref 0.3–1.2)
Total Protein: 7.3 g/dL (ref 6.5–8.1)

## 2021-01-13 LAB — URINALYSIS, COMPLETE (UACMP) WITH MICROSCOPIC
Bacteria, UA: NONE SEEN
Bilirubin Urine: NEGATIVE
Glucose, UA: NEGATIVE mg/dL
Hgb urine dipstick: NEGATIVE
Ketones, ur: NEGATIVE mg/dL
Leukocytes,Ua: NEGATIVE
Nitrite: NEGATIVE
Protein, ur: NEGATIVE mg/dL
Specific Gravity, Urine: 1.013 (ref 1.005–1.030)
Squamous Epithelial / HPF: NONE SEEN (ref 0–5)
pH: 5 (ref 5.0–8.0)

## 2021-01-13 LAB — CBC WITH DIFFERENTIAL/PLATELET
Abs Immature Granulocytes: 0.03 10*3/uL (ref 0.00–0.07)
Basophils Absolute: 0.1 10*3/uL (ref 0.0–0.1)
Basophils Relative: 1 %
Eosinophils Absolute: 0.1 10*3/uL (ref 0.0–0.5)
Eosinophils Relative: 1 %
HCT: 42.9 % (ref 39.0–52.0)
Hemoglobin: 14.1 g/dL (ref 13.0–17.0)
Immature Granulocytes: 0 %
Lymphocytes Relative: 20 %
Lymphs Abs: 1.7 10*3/uL (ref 0.7–4.0)
MCH: 28.7 pg (ref 26.0–34.0)
MCHC: 32.9 g/dL (ref 30.0–36.0)
MCV: 87.4 fL (ref 80.0–100.0)
Monocytes Absolute: 0.8 10*3/uL (ref 0.1–1.0)
Monocytes Relative: 10 %
Neutro Abs: 6 10*3/uL (ref 1.7–7.7)
Neutrophils Relative %: 68 %
Platelets: 223 10*3/uL (ref 150–400)
RBC: 4.91 MIL/uL (ref 4.22–5.81)
RDW: 13.2 % (ref 11.5–15.5)
WBC: 8.7 10*3/uL (ref 4.0–10.5)
nRBC: 0 % (ref 0.0–0.2)

## 2021-01-13 LAB — LACTIC ACID, PLASMA: Lactic Acid, Venous: 1.4 mmol/L (ref 0.5–1.9)

## 2021-01-13 LAB — URIC ACID: Uric Acid, Serum: 8.2 mg/dL (ref 3.7–8.6)

## 2021-01-13 LAB — RESP PANEL BY RT-PCR (FLU A&B, COVID) ARPGX2
Influenza A by PCR: NEGATIVE
Influenza B by PCR: NEGATIVE
SARS Coronavirus 2 by RT PCR: NEGATIVE

## 2021-01-13 MED ORDER — ONDANSETRON HCL 4 MG/2ML IJ SOLN: 4.0000 mg | Freq: Four times a day (QID) | INTRAMUSCULAR | Status: DC | PRN

## 2021-01-13 MED ORDER — ASPIRIN EC 81 MG PO TBEC
81.0000 mg | DELAYED_RELEASE_TABLET | Freq: Every day | ORAL | Status: DC
Start: 2021-01-13 — End: 2021-01-14

## 2021-01-13 MED ORDER — METOPROLOL SUCCINATE ER 25 MG PO TB24: 12.5000 mg | ORAL_TABLET | Freq: Every day | ORAL | Status: DC

## 2021-01-13 MED ORDER — POLYETHYLENE GLYCOL 3350 17 G PO PACK: 17.0000 g | PACK | Freq: Every day | ORAL | Status: DC | PRN

## 2021-01-13 MED ORDER — TORSEMIDE 20 MG PO TABS: 20.0000 mg | ORAL_TABLET | Freq: Every day | ORAL | Status: DC

## 2021-01-13 MED ORDER — VANCOMYCIN HCL 1500 MG/300ML IV SOLN
1500.0000 mg | Freq: Once | INTRAVENOUS | Status: AC
  Administered 2021-01-13: 1500 mg via INTRAVENOUS
  Filled 2021-01-13: qty 300

## 2021-01-13 MED ORDER — ACETAMINOPHEN 650 MG RE SUPP: 650.0000 mg | Freq: Four times a day (QID) | RECTAL | Status: DC | PRN

## 2021-01-13 MED ORDER — VANCOMYCIN HCL 2000 MG/400ML IV SOLN
2000.0000 mg | INTRAVENOUS | Status: DC
  Filled 2021-01-13: qty 400

## 2021-01-13 MED ORDER — ROSUVASTATIN CALCIUM 5 MG PO TABS: 5.0000 mg | ORAL_TABLET | Freq: Every day | ORAL | Status: DC

## 2021-01-13 MED ORDER — VANCOMYCIN HCL IN DEXTROSE 1-5 GM/200ML-% IV SOLN
1000.0000 mg | Freq: Once | INTRAVENOUS | Status: AC
  Administered 2021-01-13: 1000 mg via INTRAVENOUS
  Filled 2021-01-13: qty 200

## 2021-01-13 MED ORDER — PAROXETINE HCL 20 MG PO TABS: 20.0000 mg | ORAL_TABLET | Freq: Every day | ORAL | Status: DC

## 2021-01-13 MED ORDER — ACETAMINOPHEN 325 MG PO TABS: 650.0000 mg | ORAL_TABLET | Freq: Four times a day (QID) | ORAL | Status: DC | PRN

## 2021-01-13 MED ORDER — VANCOMYCIN HCL 10 G IV SOLR: 1750.0000 mg | INTRAVENOUS | Status: DC

## 2021-01-13 MED ORDER — ONDANSETRON HCL 4 MG PO TABS: 4.0000 mg | ORAL_TABLET | Freq: Four times a day (QID) | ORAL | Status: DC | PRN

## 2021-01-13 MED ORDER — TRAZODONE HCL 50 MG PO TABS
50.0000 mg | ORAL_TABLET | Freq: Every evening | ORAL | Status: DC | PRN
  Administered 2021-01-13: 50 mg via ORAL
  Filled 2021-01-13: qty 1

## 2021-01-13 MED ORDER — OXYCODONE HCL 5 MG PO TABS: 5.0000 mg | ORAL_TABLET | ORAL | Status: DC | PRN

## 2021-01-13 MED ORDER — SACUBITRIL-VALSARTAN 97-103 MG PO TABS
1.0000 | ORAL_TABLET | Freq: Two times a day (BID) | ORAL | Status: DC
  Administered 2021-01-13: 1 via ORAL
  Filled 2021-01-13 (×2): qty 1

## 2021-01-13 NOTE — Progress Notes (Signed)
Pharmacy Antibiotic Note  Samuel Gallegos is a 70 y.o. male with a past medical history of CAD and anemia admitted on 01/13/2021 with cellulitis. Pt left great toe noted to have redness and swelling. Podiatry is consulted. Pharmacy has been consulted for vancomycin dosing. Pt received one time dose of vancomycin 1g in the ED. WBC and lactic acid WNL; afebrile; Scr 1.54  4/1 Xray L foot: suspicious for osteomyelitis   Plan: Give vancomycin 1500mg  IV x1 for a total loading dose of 2500mg , followed by 2000mg  IV q24h Est AUC: 535 Est Cmax: 38 Est Cmin: 13  Monitor renal function and adjust dose as clinically indicated Obtain vancomycin levels around 4th or 5th dose if vancomycin is continued  Height: 6\' 5"  (195.6 cm) Weight: (!) 148.3 kg (327 lb) IBW/kg (Calculated) : 89.1  Temp (24hrs), Avg:98.7 F (37.1 C), Min:98.6 F (37 C), Max:98.8 F (37.1 C)  Recent Labs  Lab 01/13/21 1104  WBC 8.7  CREATININE 1.54*  LATICACIDVEN 1.4    Estimated Creatinine Clearance: 70.2 mL/min (A) (by C-G formula based on SCr of 1.54 mg/dL (H)).    Allergies  Allergen Reactions  . Amoxicillin Itching    Antimicrobials this admission: 4/1 vancomycin >>  Microbiology results: 4/1 BCx: pending  Thank you for allowing pharmacy to be a part of this patient's care.  , PharmD Pharmacy Resident  01/13/2021 4:46 PM

## 2021-01-13 NOTE — ED Notes (Signed)
Unable to obtain urine sample prior to start of ABX. Will not delay start of ABX.

## 2021-01-13 NOTE — ED Triage Notes (Signed)
Pt to ED from [facility] for left big toe infection. Sent for IV antibiotics.  Pt denies fevers. Alert and oriented, clear speech.  Swelling and redness noted to left big toe  States this started 3 weeks ago  No hx diabetes

## 2021-01-13 NOTE — H&P (Signed)
[facility] History and Physical  Dyllin Gulley VVO:[id number] DOB: Nov 02, 1950 DOA: 01/13/2021  Referring physician: Dr. Lenard Lance PCP: Allegra Grana, FNP   Chief Complaint: Toe swelling  HPI: Samuel Gallegos is a 70 y.o. male with history of systolic CHF, CAD, hyperlipidemia, who presents with left great toe swelling.  Reports that over the past 3 weeks he has noticed progressive swelling of his left great toe.  It is not particularly painful to him.  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  In the ED initial vital signs unremarkable.  CBC with no white count, CMP largely unremarkable with creatinine at baseline of 1.5, glucose mildly elevated at 130.  An x-ray was obtained which showed signs of possible osteomyelitis versus end-stage crystal arthropathy signs and soft tissue swelling.  Also notably a possible large loose body posterior to the tibiotalar joint.  He was started on vancomycin and admitted to the hospital.  Review of Systems:  Pertinent positives and negative per HPI, all others reviewed and negative  History reviewed. No pertinent past medical history. Past Surgical History:  Procedure Laterality Date  . HERNIA REPAIR  1953   Social History:  reports that he has never smoked. He has never used smokeless tobacco. He reports that he does not drink alcohol and does not use drugs.  Allergies  Allergen Reactions  . Amoxicillin Itching    Family History  Problem Relation Age of Onset  . Cancer Mother   . Hypertension Father      Prior to Admission medications   Medication Sig Start Date End Date Taking? Authorizing Provider  aspirin EC 81 MG tablet Take 1 tablet (81 mg total) by mouth daily. Swallow whole. 06/22/20  Yes Antonieta Iba, MD  metoprolol succinate  (TOPROL XL) 25 MG 24 hr tablet Take 0.5 tablets (12.5 mg total) by mouth daily. 08/08/20  Yes Gollan, Tollie Pizza, MD  PARoxetine (PAXIL) 20 MG tablet Take 1 tablet (20 mg total) by mouth daily. 12/04/19  Yes Arnett, Lyn Records, FNP  rosuvastatin (CRESTOR) 5 MG tablet Take 1 tablet (5 mg total) by mouth daily. 02/23/20  Yes Arnett, Lyn Records, FNP  sacubitril-valsartan (ENTRESTO) 97-103 MG Take 1 tablet by mouth 2 (two) times daily. 12/05/20  Yes Antonieta Iba, MD  torsemide (DEMADEX) 20 MG tablet Take 1 tablet (20 mg total) by mouth daily. 01/11/21  Yes [name], NP   Physical Exam: Vitals:   01/13/21 1330 01/13/21 1400 01/13/21 1430 01/13/21 1520  BP: (!) 109/55 (!) 116/55 (!) 133/55 (!) 115/49  Pulse: 73 66 80 67  Resp: 18 16  17   Temp:    98.6 F (37 C)  TempSrc:    Oral  SpO2: 96% 97% 98% 97%  Weight:      Height:        Wt Readings from Last 3 Encounters:  01/13/21 (!) 148.3 kg  11/08/20 (!) 149.7 kg  10/26/20 (!) 148.8 kg     . General:  Appears calm and comfortable . Eyes: PERRL, normal lids, irises & conjunctiva . ENT: grossly normal hearing, lips & tongue . Neck: no masses . Cardiovascular: RRR. No LE edema. 12/24/20 Respiratory: Normal respiratory effort. . Musculoskeletal: significant swelling and mild fluctuance of the L great  toe, no drainage appreciated, nontender to palpation . Psychiatric: grossly normal mood and affect, speech fluent and appropriate . Neurologic: grossly non-focal.          Labs on Admission:  Basic Metabolic Panel: Recent Labs  Lab 01/13/21 1104  NA 136  K 4.5  CL 105  CO2 22  GLUCOSE 130*  BUN 31*  CREATININE 1.54*  CALCIUM 9.1   Liver Function Tests: Recent Labs  Lab 01/13/21 1104  AST 15  ALT 10  ALKPHOS 61  BILITOT 0.9  PROT 7.3  ALBUMIN 4.1   No results for input(s): LIPASE, AMYLASE in the last 168 hours. No results for input(s): AMMONIA in the last 168 hours. CBC: Recent Labs  Lab 01/13/21 1104  WBC 8.7   NEUTROABS 6.0  HGB 14.1  HCT 42.9  MCV 87.4  PLT 223   Cardiac Enzymes: No results for input(s): CKTOTAL, CKMB, CKMBINDEX, TROPONINI in the last 168 hours.  BNP (last 3 results) No results for input(s): BNP in the last 8760 hours.  ProBNP (last 3 results) No results for input(s): PROBNP in the last 8760 hours.  CBG: No results for input(s): GLUCAP in the last 168 hours.  Radiological Exams on Admission: DG Foot Complete Left  Result Date: 01/13/2021 CLINICAL DATA:  Diabetic wound of the left great toe EXAM: LEFT FOOT - COMPLETE 3+ VIEW COMPARISON:  None. FINDINGS: Endplate erosions centered at the great toe interphalangeal joint most pronounced at the base of the distal phalanx. There is lateral subluxation at the great toe IP joint without dislocation. No fracture. Prominent soft tissue swelling of the left great toe. Remaining osseous structures are within normal limits. Prominent calcific density posterior to the tibiotalar joint measuring 2.2 cm in diameter, potentially large loose body. IMPRESSION: 1. Endplate erosions centered at the great toe interphalangeal joint suspicious for osteomyelitis. Sequela of a crystalline arthropathy such as gout could result in a similar appearance. 2. Lateral subluxation at the great toe IP joint without dislocation. 3. Soft tissue swelling. 4. Possible large loose body posterior to the tibiotalar joint. Electronically Signed   By: Duanne Guess D.O.   On: 01/13/2021 13:14    EKG: Not obtained  Assessment/Plan Active Problems:   Mixed hyperlipidemia   HFrEF (heart failure with reduced ejection fraction) (HCC)   CAD (coronary artery disease)   Toe infection   Samuel Gallegos is a 70 y.o. male with history of systolic CHF, CAD, hyperlipidemia, who presents with left great toe swelling concerning for osteomyelitis versus crystal arthropathy.  #Left toe swelling #Osteomyelitis vs Crystalline arthopathy Patient presenting with 3-week  history of progressively worsening swelling and erythema of left great toe.  Some signs point towards infection while others could be consistent with gout-like process.  Podiatry has been consulted and recommended MRI as well as ABI to further evaluate.  Further work-up per their recommendations. -Continue vancomycin per pharmacy protocol -Follow-up results of MRI and ABI -Appreciate podiatry input  #Known Medical problems CAD-continue aspirin, metoprolol, Crestor, Entresto, torsemide Depression-continue Paxil  Code Status: Full Code DVT Prophylaxis: SCDs Family Communication: daughter updated over the phone Disposition Plan: Inpatient, Med-Surg   Time spent: 50 min  Venora Maples MD/MPH [facility]  Note:  This document was prepared using Conservation officer, historic buildings and may include unintentional dictation errors.

## 2021-01-13 NOTE — ED Provider Notes (Signed)
[facility] Emergency Department Provider Note  Time seen: 12:28 PM  I have reviewed the triage vital signs and the nursing notes.   HISTORY  Chief Complaint infection   HPI Samuel Gallegos is a 70 y.o. male with a past medical history of CAD anemia, presents to the emergency department for left great toe redness and swelling.  According to the patient over the past few days he has noticed increased redness and swelling to left great toe.  Denies any pain or fever.  Patient went to [facility] clinic today for evaluation and they sent the patient to the emergency department for IV antibiotics.  Overall patient appears well, no distress, calm cooperative and pleasant.   History reviewed. No pertinent past medical history.  Patient Active Problem List   Diagnosis Date Noted  . Hypogonadism in male 10/26/2020  . Erectile dysfunction due to arterial insufficiency 10/26/2020  . CAD (coronary artery disease) 08/05/2020  . Class 2 severe obesity due to excess calories with serious comorbidity and body mass index (BMI) of 39.0 to 39.9 in adult (HCC) 04/11/2020  . Mild dilation of ascending aorta (HCC) 04/11/2020  . Valvular heart disease 04/11/2020  . HFrEF (heart failure with reduced ejection fraction) (HCC) 04/11/2020  . Onychomycosis 02/23/2020  . Mixed hyperlipidemia 02/23/2020  . SOB (shortness of breath) on exertion 02/23/2020  . Lower extremity edema 02/18/2020  . Heart murmur 02/18/2020  . Abnormal EKG 02/18/2020  . Right foot pain 01/20/2020  . Anxiety 03/05/2019  . Low testosterone in male 03/05/2019    Past Surgical History:  Procedure Laterality Date  . HERNIA REPAIR  1953    Prior to Admission medications   Medication Sig Start Date End Date Taking? Authorizing Provider  aspirin EC 81 MG tablet Take 1 tablet (81 mg total) by mouth daily. Swallow whole. 06/22/20   Antonieta Iba, MD  metoprolol succinate (TOPROL XL) 25 MG 24 hr tablet Take 0.5  tablets (12.5 mg total) by mouth daily. 08/08/20   Antonieta Iba, MD  PARoxetine (PAXIL) 20 MG tablet Take 1 tablet (20 mg total) by mouth daily. 12/04/19   Allegra Grana, FNP  rosuvastatin (CRESTOR) 5 MG tablet Take 1 tablet (5 mg total) by mouth daily. 02/23/20   Allegra Grana, FNP  sacubitril-valsartan (ENTRESTO) 97-103 MG Take 1 tablet by mouth 2 (two) times daily. 12/05/20   Antonieta Iba, MD  torsemide (DEMADEX) 20 MG tablet Take 1 tablet (20 mg total) by mouth daily. 01/11/21   [name], NP    Allergies  Allergen Reactions  . Amoxicillin Itching    Family History  Problem Relation Age of Onset  . Cancer Mother   . Hypertension Father     Social History Social History   Tobacco Use  . Smoking status: Never Smoker  . Smokeless tobacco: Never Used  Vaping Use  . Vaping Use: Never used  Substance Use Topics  . Alcohol use: Never  . Drug use: Never    Review of Systems Constitutional: Negative for fever. Cardiovascular: Negative for chest pain. Respiratory: Negative for shortness of breath. Gastrointestinal: Negative for abdominal pain, vomiting Musculoskeletal: Left great toe swelling and redness extending proximally to the distal foot. Skin: Left great toe swelling and redness Neurological: Negative for headache All other ROS negative  ____________________________________________   PHYSICAL EXAM:  VITAL SIGNS: ED Triage Vitals  Enc Vitals Group     BP 01/13/21 1101 (!) 158/97     Pulse  Rate 01/13/21 1059 73     Resp 01/13/21 1059 20     Temp 01/13/21 1059 98.8 F (37.1 C)     Temp Source 01/13/21 1059 Oral     SpO2 01/13/21 1059 97 %     Weight 01/13/21 1101 (!) 327 lb (148.3 kg)     Height 01/13/21 1101 6\' 5"  (1.956 m)     Head Circumference --      Peak Flow --      Pain Score 01/13/21 1101 0     Pain Loc --      Pain Edu? --      Excl. in GC? --    Constitutional: Alert and oriented. Well appearing and in no  distress. Eyes: Normal exam ENT      Head: Normocephalic and atraumatic.      Mouth/Throat: Mucous membranes are moist. Cardiovascular: Normal rate, regular rhythm.  Respiratory: Normal respiratory effort without tachypnea nor retractions. Breath sounds are clear and equal bilaterally. No wheezes/rales/rhonchi. Gastrointestinal: Soft and nontender. No distention.  Musculoskeletal: Significant swelling/edema of the left great toe extending to the distal foot with erythema.  Nontender to palpation. Neurologic:  Normal speech and language. No gross focal neurologic deficits Skin:  Skin is warm, dry and intact.  Psychiatric: Mood and affect are normal.   ____________________________________________     RADIOLOGY  X-ray shows erosions of the great toe interphalangeal joint suspicious for osteomyelitis.  Soft tissue swelling.  ____________________________________________   INITIAL IMPRESSION / ASSESSMENT AND PLAN / ED COURSE  Pertinent labs & imaging results that were available during my care of the patient were reviewed by me and considered in my medical decision making (see chart for details).   Patient presents to the emergency department for worsening swelling and redness of the left great toe extending to the distal foot.  Clinical exam is consistent with infection.  X-ray is suspicious for osteomyelitis.  Reassuringly patient's lab work is largely within normal limits including a normal white blood cell count.  However given the degree of edema and erythema along with signs of osteomyelitis we will admit to the hospital service for IV antibiotics and likely podiatry consultation.  Samuel Gallegos was evaluated in Emergency Department on 01/13/2021 for the symptoms described in the history of present illness. He was evaluated in the context of the global COVID-19 pandemic, which necessitated consideration that the patient might be at risk for infection with the SARS-CoV-2 virus that  causes COVID-19. Institutional protocols and algorithms that pertain to the evaluation of patients at risk for COVID-19 are in a state of rapid change based on information released by regulatory bodies including the CDC and federal and state organizations. These policies and algorithms were followed during the patient's care in the ED.  ____________________________________________   FINAL CLINICAL IMPRESSION(S) / ED DIAGNOSES  Cellulitis Osteomyelitis   03/15/2021, MD 01/13/21 1324

## 2021-01-13 NOTE — ED Notes (Addendum)
Admitting MD at bedside.

## 2021-01-13 NOTE — ED Notes (Signed)
X-ray at bedside

## 2021-01-13 NOTE — Consult Note (Addendum)
ORTHOPAEDIC CONSULTATION  REQUESTING PHYSICIAN: Venora Maples, MD  Chief Complaint: Red swollen left great toe  HPI: Samuel Gallegos is a 70 y.o. male who complains of worsening redness and swelling to his left great toe.  He states the last 3 weeks that his left great toe has become more swollen than typical.  He states for over a year now he has had swelling and some redness into his great toe.  No pain.  He noticed a little bit of worsening redness just proximal to his great toe joint and was seen in urgent care today.  Sent to the ER for further evaluation and work-up.  He is nondiabetic.  No recent history of trauma though he relates a farm animal stepping on his left great toe many years ago.  He denies pain to his great toe at this point.  No fever or chills.  No history of gout.  X-rays were concerning for possible osteomyelitis.  History reviewed. No pertinent past medical history. Past Surgical History:  Procedure Laterality Date  . HERNIA REPAIR  1953   Social History   Socioeconomic History  . Marital status: Married    Spouse name: Not on file  . Number of children: Not on file  . Years of education: Not on file  . Highest education level: Not on file  Occupational History  . Not on file  Tobacco Use  . Smoking status: Never Smoker  . Smokeless tobacco: Never Used  Vaping Use  . Vaping Use: Never used  Substance and Sexual Activity  . Alcohol use: Never  . Drug use: Never  . Sexual activity: Not on file  Other Topics Concern  . Not on file  Social History Narrative   [name] , drives truck as well   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   Married   Social Determinants of Health   Financial Resource Strain: Low Risk   . Difficulty of Paying Living Expenses: Not hard at all  Food Insecurity: No Food Insecurity  . Worried About Programme researcher, broadcasting/film/video in the Last Year: Never true  . Ran Out of Food in the Last Year: Never true  Transportation Needs: No Transportation  Needs  . Lack of Transportation (Medical): No  . Lack of Transportation (Non-Medical): No  Physical Activity: Not on file  Stress: No Stress Concern Present  . Feeling of Stress : Not at all  Social Connections: Unknown  . Frequency of Communication with Friends and Family: Not on file  . Frequency of Social Gatherings with Friends and Family: Not on file  . Attends Religious Services: Not on file  . Active Member of Clubs or Organizations: Not on file  . Attends Banker Meetings: Not on file  . Marital Status: Married   Family History  Problem Relation Age of Onset  . Cancer Mother   . Hypertension Father    Allergies  Allergen Reactions  . Amoxicillin Itching   Prior to Admission medications   Medication Sig Start Date End Date Taking? Authorizing Provider  aspirin EC 81 MG tablet Take 1 tablet (81 mg total) by mouth daily. Swallow whole. 06/22/20   Antonieta Iba, MD  metoprolol succinate (TOPROL XL) 25 MG 24 hr tablet Take 0.5 tablets (12.5 mg total) by mouth daily. 08/08/20   Antonieta Iba, MD  PARoxetine (PAXIL) 20 MG tablet Take 1 tablet (20 mg total) by mouth daily. 12/04/19   Allegra Grana, FNP  rosuvastatin (CRESTOR) 5 MG  tablet Take 1 tablet (5 mg total) by mouth daily. 02/23/20   Allegra Grana, FNP  sacubitril-valsartan (ENTRESTO) 97-103 MG Take 1 tablet by mouth 2 (two) times daily. 12/05/20   Antonieta Iba, MD  torsemide (DEMADEX) 20 MG tablet Take 1 tablet (20 mg total) by mouth daily. 01/11/21   [name], NP   DG Foot Complete Left  Result Date: 01/13/2021 CLINICAL DATA:  Diabetic wound of the left great toe EXAM: LEFT FOOT - COMPLETE 3+ VIEW COMPARISON:  None. FINDINGS: Endplate erosions centered at the great toe interphalangeal joint most pronounced at the base of the distal phalanx. There is lateral subluxation at the great toe IP joint without dislocation. No fracture. Prominent soft tissue swelling of the left great toe.  Remaining osseous structures are within normal limits. Prominent calcific density posterior to the tibiotalar joint measuring 2.2 cm in diameter, potentially large loose body. IMPRESSION: 1. Endplate erosions centered at the great toe interphalangeal joint suspicious for osteomyelitis. Sequela of a crystalline arthropathy such as gout could result in a similar appearance. 2. Lateral subluxation at the great toe IP joint without dislocation. 3. Soft tissue swelling. 4. Possible large loose body posterior to the tibiotalar joint. Electronically Signed   By: Duanne Guess D.O.   On: 01/13/2021 13:14    Positive ROS: All other systems have been reviewed and were otherwise negative with the exception of those mentioned in the HPI and as above.  12 point ROS was performed.  Physical Exam: General: Alert and oriented.  No apparent distress.  Vascular:  Left foot: DP and PT pulses are thready  Right foot: Not evaluated in ER  Neuro:intact gross sensation  Derm: Diffuse erythema to the entire leg great toe.  Erythema just proximal to the metatarsophalangeal joint region patient states this is new.  No lymphangitic streaking.  Ortho/MS: Entire lower leg is diffusely edematous but he definitely has focal edema to the great toe.  There is a firmness and fullness diffusely to the entire great toe as well.  No open wounds at this time.  Assessment: Cellulitis left foot Diffuse edema left great toe concerning for infection versus other arthropathy Nonpalpable pulses  Plan: He definitely has severely diffuse erythema of his left great toe with a little bit of proximal cellulitis at the great toe joint area.  There is no open wound.  No drainage.  He is not painful.  He may have just worsening gout that has been chronic in nature to the region.  Will obtain a uric acid level.  His pulses are not strongly palpable at this point.  Will obtain noninvasive testing for now.  We will also recommend an MRI as  there is a firm nodular diffuse area along the entire great toe.  We will rule out some type of soft tissue mass in the area. I will defer antibiotics to medicine at this time. We will follow-up tomorrow.    Irean Hong, DPM Cell [phone]   01/13/2021 3:04 PM

## 2021-01-14 ENCOUNTER — Inpatient Hospital Stay: Payer: Medicare HMO

## 2021-01-14 DIAGNOSIS — L089 Local infection of the skin and subcutaneous tissue, unspecified: Secondary | ICD-10-CM

## 2021-01-14 LAB — CBC
HCT: 41.7 % (ref 39.0–52.0)
Hemoglobin: 13.8 g/dL (ref 13.0–17.0)
MCH: 29.2 pg (ref 26.0–34.0)
MCHC: 33.1 g/dL (ref 30.0–36.0)
MCV: 88.3 fL (ref 80.0–100.0)
Platelets: 199 10*3/uL (ref 150–400)
RBC: 4.72 MIL/uL (ref 4.22–5.81)
RDW: 13.2 % (ref 11.5–15.5)
WBC: 6.8 10*3/uL (ref 4.0–10.5)
nRBC: 0 % (ref 0.0–0.2)

## 2021-01-14 LAB — COMPREHENSIVE METABOLIC PANEL
ALT: 11 U/L (ref 0–44)
AST: 11 U/L — ABNORMAL LOW (ref 15–41)
Albumin: 3.7 g/dL (ref 3.5–5.0)
Alkaline Phosphatase: 52 U/L (ref 38–126)
Anion gap: 6 (ref 5–15)
BUN: 27 mg/dL — ABNORMAL HIGH (ref 8–23)
CO2: 28 mmol/L (ref 22–32)
Calcium: 9 mg/dL (ref 8.9–10.3)
Chloride: 104 mmol/L (ref 98–111)
Creatinine, Ser: 1.43 mg/dL — ABNORMAL HIGH (ref 0.61–1.24)
GFR, Estimated: 52 mL/min — ABNORMAL LOW (ref >60)
Glucose, Bld: 114 mg/dL — ABNORMAL HIGH (ref 70–99)
Potassium: 4.9 mmol/L (ref 3.5–5.1)
Sodium: 138 mmol/L (ref 135–145)
Total Bilirubin: 1.4 mg/dL — ABNORMAL HIGH (ref 0.3–1.2)
Total Protein: 6.6 g/dL (ref 6.5–8.1)

## 2021-01-14 LAB — PROTIME-INR
INR: 1.1 (ref 0.8–1.2)
Prothrombin Time: 13.4 seconds (ref 11.4–15.2)

## 2021-01-14 LAB — APTT: aPTT: 29 seconds (ref 24–36)

## 2021-01-14 LAB — TYPE AND SCREEN
ABO/RH(D): B POS
Antibody Screen: NEGATIVE

## 2021-01-14 IMAGING — MR MR FOOT*L* WO/W CM
9 series · 40 of 40 positions shown · IV contrast (gadavist)
Comparison: Radiographs [DATE]

CLINICAL DATA: Left great toe firmness and swelling without
significant pain. Evaluate for gout versus osteomyelitis. No current
leukocytosis or history of diabetes.

EXAM:
MRI OF THE LEFT FOREFOOT WITHOUT AND WITH CONTRAST
TECHNIQUE: Multiplanar, multisequence MR imaging of the left forefoot was
performed both before and after administration of intravenous
contrast.
CONTRAST:  7.5mL GADAVIST GADOBUTROL 1 MMOL/ML IV SOLN

[Series 5: T1 · coronal · left · 3.5mm · 0.44mm/px · 5 of 45 slices shown (1 of 2)]
[im 1/45]
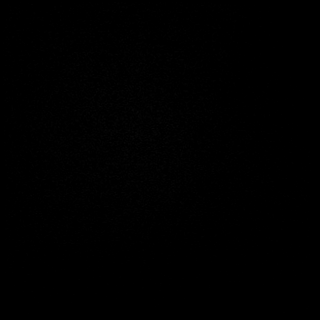
[im 12/45]
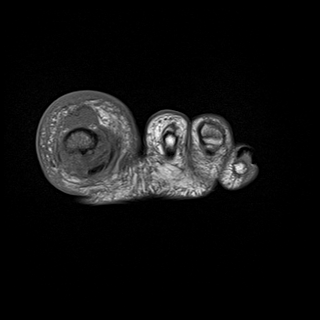
[im 23/45]
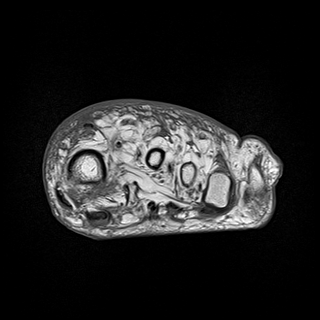
[im 34/45]
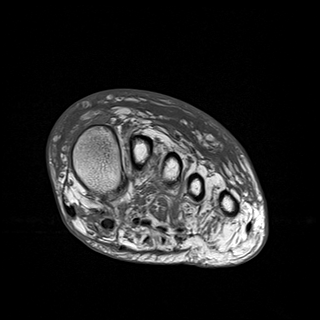
[im 45/45]
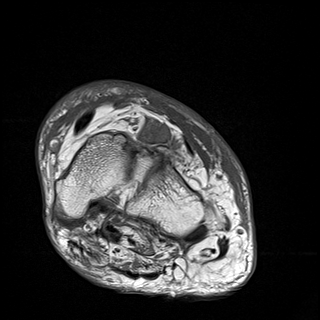

[Series 6: T2 · coronal · left · 3.5mm · 0.44mm/px · 5 of 45 slices shown (1 of 2)]
[im 1/45]
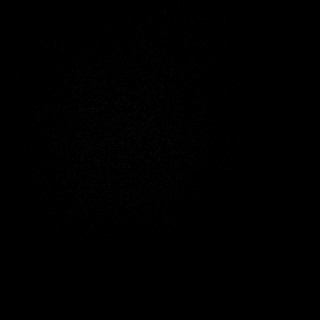
[im 12/45]
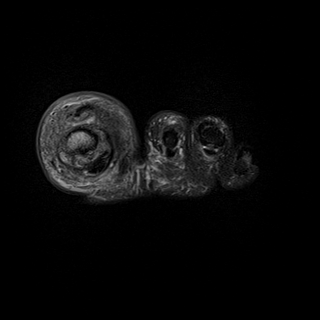
[im 23/45]
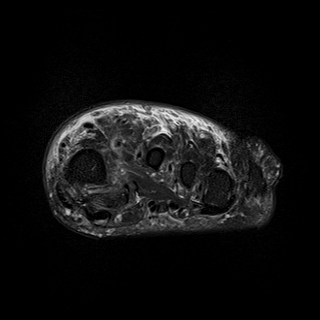
[im 34/45]
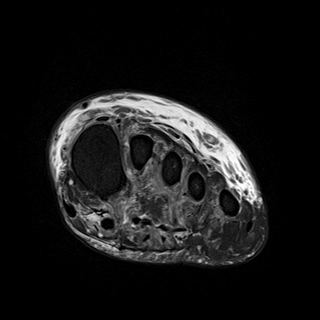
[im 45/45]
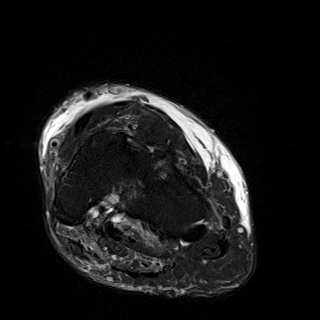

[Series 8: T1 · axial · left · 3.0mm · 0.78mm/px · z∈[-77,-6]mm · 2 of 22 slices shown (2 of 2)]
[im 1/22]
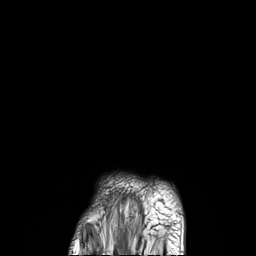
[im 22/22]
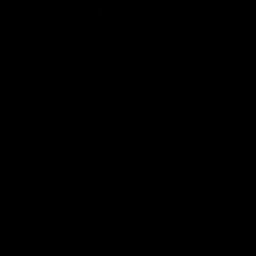

[Series 10: T2 · axial · left · 3.0mm · 0.78mm/px · z∈[-73,-9]mm · 3 of 20 slices shown (2 of 2)]
[im 1/20]
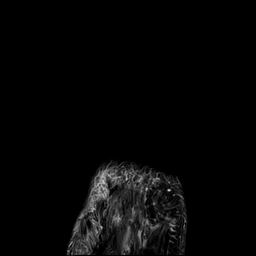
[im 10/20]
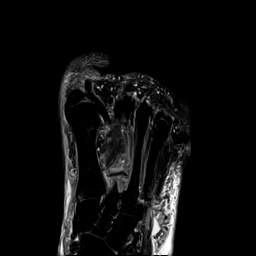
[im 20/20]
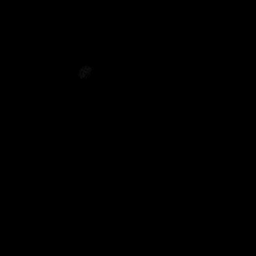

[Series 11: STIR · sagittal · left · 3.0mm · 0.62mm/px · 5 of 37 slices shown]
[im 1/37]
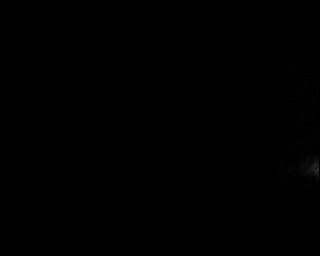
[im 10/37]
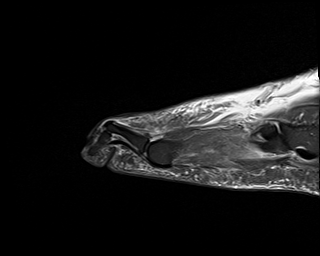
[im 19/37]
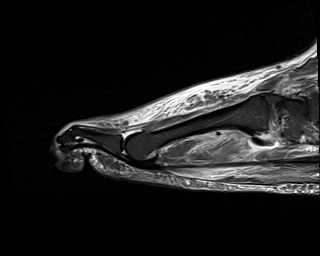
[im 28/37]
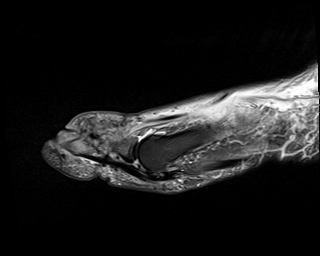
[im 37/37]
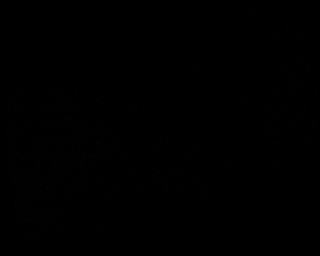

[Series 12: T1 fat-sat · coronal · non-contrast · left · 3.5mm · 0.55mm/px · 6 of 45 slices shown (1 of 3)]
[im 1/45]
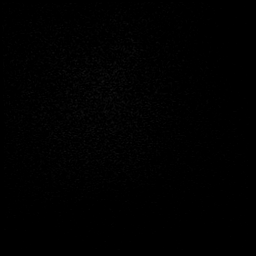
[im 9/45]
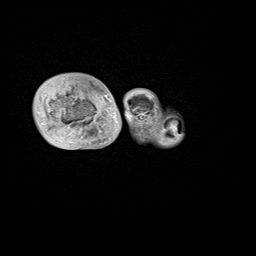
[im 18/45]
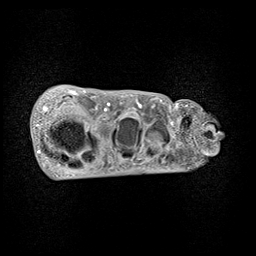
[im 27/45]
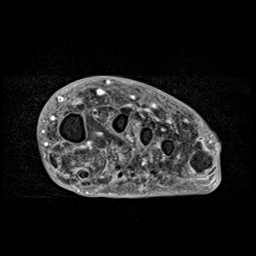
[im 36/45]
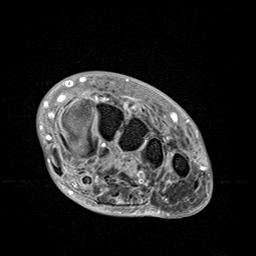
[im 45/45]
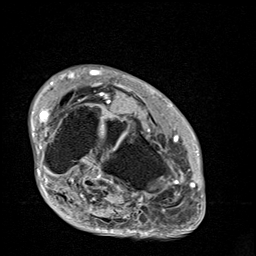

[Series 13: T1 fat-sat post-contrast · coronal · left · 3.5mm · 0.55mm/px · 6 of 45 slices shown]
[im 1/45]
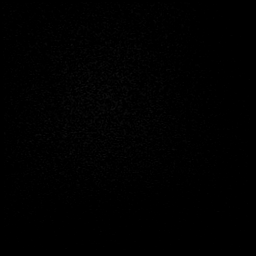
[im 9/45]
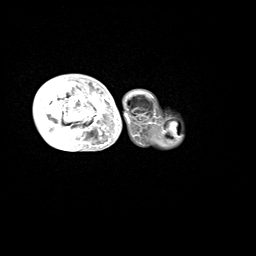
[im 18/45]
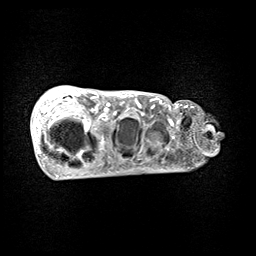
[im 27/45]
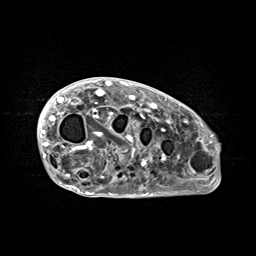
[im 36/45]
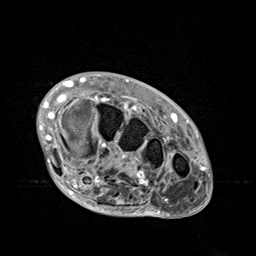
[im 45/45]
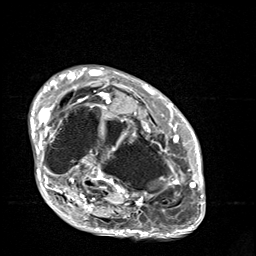

[Series 14: T1 fat-sat · sagittal · left · 3.0mm · 0.62mm/px · 5 of 37 slices shown (2 of 3)]
[im 1/37]
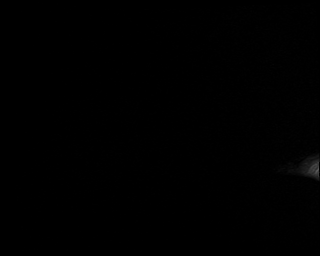
[im 10/37]
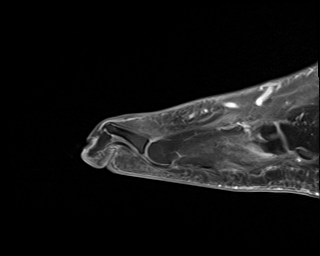
[im 19/37]
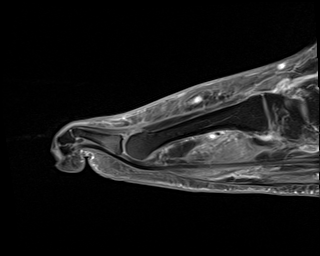
[im 28/37]
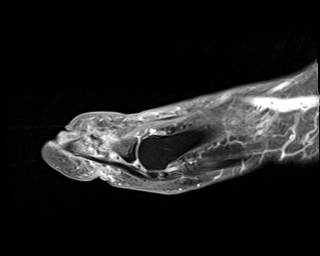
[im 37/37]
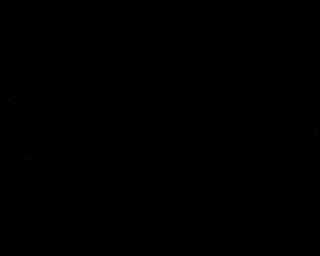

[Series 15: T1 fat-sat · axial · left · 3.0mm · 0.78mm/px · z∈[-77,-6]mm · 3 of 22 slices shown (3 of 3)]
[im 1/22]
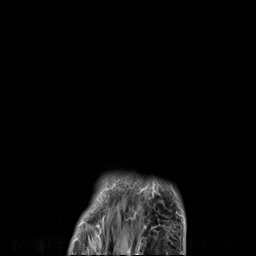
[im 11/22]
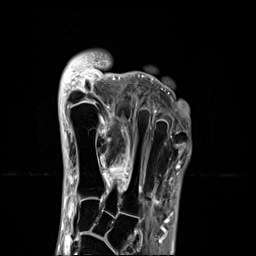
[im 22/22]
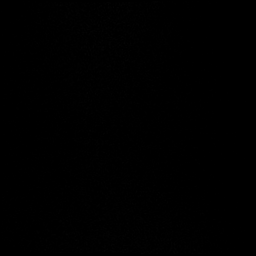

[40 of 40 positions shown; findings below may reference images not displayed]

FINDINGS: Bones/Joint/Cartilage

As demonstrated radiographically, there is advanced arthropathy at
the interphalangeal joint of the great toe. There is advanced joint
space narrowing with osteophytes, subchondral erosions and lateral
subluxation. There is associated subchondral marrow edema and
enhancement. There is a small joint effusion with diffuse synovial
enhancement.

No other significant arthropathic changes are identified. There is a
small effusion of the 1st metatarsophalangeal joint, and the fibular
sesamoid of the 1st metatarsal is sclerotic. There are clawtoe
deformities of the 2nd through 5th digits.

Ligaments

The Lisfranc ligament is intact. The collateral ligaments of the
metatarsophalangeal joints are intact.

Muscles and Tendons

The forefoot tendons appear intact without significant
tenosynovitis. Mild forefoot muscular atrophy is present. There is
no focal muscular fluid collection or suspicious enhancement.

Soft tissues

Soft tissue swelling throughout the great toe with subcutaneous
edema and heterogeneous enhancement consistent with inflammation. No
focal fluid collection, definite soft tissue ulceration or soft
tissue emphysema identified.
IMPRESSION: 1. Advanced arthropathy at the interphalangeal joint of the great
toe as demonstrated radiographically. This is associated with marrow
and surrounding soft tissue edema and enhancement, implying active
inflammatory arthropathy. The findings are not completely specific,
and clinical correlation is necessary to differentiate osteomyelitis
from gout.
2. No soft tissue abscess or discrete soft tissue ulceration
identified. If the patient has a skin defect/draining wound,
osteomyelitis would be more likely.
3. No other significant forefoot arthropathic changes.

## 2021-01-14 MED ORDER — GADOBUTROL 1 MMOL/ML IV SOLN
7.5000 mL | Freq: Once | INTRAVENOUS | Status: AC | PRN
  Administered 2021-01-14: 7.5 mL via INTRAVENOUS

## 2021-01-14 NOTE — Discharge Summary (Signed)
Left AMA  Patient was admitted last night for left big toe swelling for past 3 weeks.  He was seen by podiatry, underwent MRI of the foot this morning. I got a call from RN that patient wants to leave AMA Went to see patient, he was not in the room. As per RN all his belongings were not in the room. Patient left AMA, before I could see the patient.

## 2021-01-14 NOTE — Progress Notes (Signed)
Pt away to MRI during AM shift change. Pt arrived back to the floor and NT came in room to greet pt and introduce self. Pt stated to her "I want to leave, they are not doing anything for me here" NT came to tell this RN. RN messaged MD to come talk to the pt and RN went to grab something prior to talking to the pt. RN met with MD in hallway who stated pt wasn't in his room. All pt belongings are also gone including his keys and clothing.

## 2021-01-18 ENCOUNTER — Other Ambulatory Visit: Payer: Self-pay

## 2021-01-18 ENCOUNTER — Ambulatory Visit: Payer: Medicare HMO | Admitting: Podiatry

## 2021-01-18 ENCOUNTER — Encounter: Payer: Self-pay | Admitting: Podiatry

## 2021-01-18 DIAGNOSIS — S92412S Displaced fracture of proximal phalanx of left great toe, sequela: Secondary | ICD-10-CM

## 2021-01-18 DIAGNOSIS — M109 Gout, unspecified: Secondary | ICD-10-CM

## 2021-01-18 LAB — CULTURE, BLOOD (ROUTINE X 2)
Culture: NO GROWTH
Culture: NO GROWTH
Special Requests: ADEQUATE
Special Requests: ADEQUATE

## 2021-01-18 MED ORDER — TRIAMCINOLONE ACETONIDE 40 MG/ML IJ SUSP
20.0000 mg | Freq: Once | INTRAMUSCULAR | Status: AC
  Administered 2021-01-18: 20 mg

## 2021-01-18 NOTE — Progress Notes (Signed)
Subjective:  Patient ID: Samuel Gallegos, male    DOB: 1950-11-14,  MRN: [id number]  Chief Complaint  Patient presents with  . Toe Injury    Patient presents today for red, swollen left great toe x years from injury.  He denies any pain.  He stated that his shoe rubbed a raw place on toe and was seen at [facility] ED.  Was given Vancomycin for treatment    70 y.o. male presents with the above complaint. History confirmed with patient.  Has a history of injury of this big toe 10 years ago, he had stepped on by a farm animal.  Had redness and swelling in the toe last week and went to the emergency room [facility].  They did lab work and an MRI.  Not particularly painful right now  Objective:  Physical Exam: warm, good capillary refill, no trophic changes or ulcerative lesions, normal DP and PT pulses and normal sensory exam. Left Foot: Swollen red erythematous toe at the IPJ of the left hallux, no open ulceration or drainage Radiographs: X-ray of the left foot: Prior intra-articular fracture with malunion left IPJ hallux  Study Result  Narrative & Impression  CLINICAL DATA:  Left great toe firmness and swelling without significant pain. Evaluate for gout versus osteomyelitis. No current leukocytosis or history of diabetes.  EXAM: MRI OF THE LEFT FOREFOOT WITHOUT AND WITH CONTRAST  TECHNIQUE: Multiplanar, multisequence MR imaging of the left forefoot was performed both before and after administration of intravenous contrast.  CONTRAST:  7.57mL GADAVIST GADOBUTROL 1 MMOL/ML IV SOLN  COMPARISON:  Radiographs 01/13/2021  FINDINGS: Bones/Joint/Cartilage  As demonstrated radiographically, there is advanced arthropathy at the interphalangeal joint of the great toe. There is advanced joint space narrowing with osteophytes, subchondral erosions and lateral subluxation. There is associated subchondral marrow edema and enhancement. There is a small joint effusion with diffuse  synovial enhancement.  No other significant arthropathic changes are identified. There is a small effusion of the 1st metatarsophalangeal joint, and the fibular sesamoid of the 1st metatarsal is sclerotic. There are clawtoe deformities of the 2nd through 5th digits.  Ligaments  The Lisfranc ligament is intact. The collateral ligaments of the metatarsophalangeal joints are intact.  Muscles and Tendons  The forefoot tendons appear intact without significant tenosynovitis. Mild forefoot muscular atrophy is present. There is no focal muscular fluid collection or suspicious enhancement.  Soft tissues  Soft tissue swelling throughout the great toe with subcutaneous edema and heterogeneous enhancement consistent with inflammation. No focal fluid collection, definite soft tissue ulceration or soft tissue emphysema identified.  IMPRESSION: 1. Advanced arthropathy at the interphalangeal joint of the great toe as demonstrated radiographically. This is associated with marrow and surrounding soft tissue edema and enhancement, implying active inflammatory arthropathy. The findings are not completely specific, and clinical correlation is necessary to differentiate osteomyelitis from gout. 2. No soft tissue abscess or discrete soft tissue ulceration identified. If the patient has a skin defect/draining wound, osteomyelitis would be more likely. 3. No other significant forefoot arthropathic changes.   Electronically Signed   By: Carey Bullocks M.D.   On: 01/14/2021 09:10   Uric acid 8.2, creatinine elevated at 1.5 Assessment:   1. Gouty arthritis of left great toe   2. Closed displaced fracture of proximal phalanx of left great toe, sequela      Plan:  Patient was evaluated and treated and all questions answered.  Reviewed the clinical and radiographic findings as well as the lab work with him  I think this is sequela of the previous fracture complicated by an acute  gout attack.  He says he has never had gout before.  His uric acid when drawn was 8.2, his creatinine was elevated above his baseline at 1.5, I suspect he had decreased kidney function leading to gouty arthropathy in the malunited fracture joint.  No open ulceration or indication this is osteomyelitis.  The MRI confirmed there is no evidence of mild lytic destruction either.  I recommended intra-articular steroid injection today and performed this after sterile prep with Betadine with 20 mg Kenalog and 5 mg of Marcaine.  He tolerated this well.  Return if symptoms worsen or fail to improve.

## 2021-02-01 ENCOUNTER — Other Ambulatory Visit: Payer: Self-pay | Admitting: Family

## 2021-02-01 ENCOUNTER — Other Ambulatory Visit: Payer: Self-pay

## 2021-02-01 ENCOUNTER — Other Ambulatory Visit
Admission: RE | Admit: 2021-02-01 | Discharge: 2021-02-01 | Disposition: A | Discharge status: Home / Self Care | Payer: Medicare HMO | Attending: Family | Admitting: Family

## 2021-02-01 ENCOUNTER — Encounter: Payer: Self-pay | Admitting: Family

## 2021-02-01 ENCOUNTER — Ambulatory Visit (INDEPENDENT_AMBULATORY_CARE_PROVIDER_SITE_OTHER): Payer: Medicare HMO | Admitting: Family

## 2021-02-01 VITALS — BP 130/70 | HR 54 | Temp 97.6°F | Ht 77.0 in | Wt 332.8 lb

## 2021-02-01 DIAGNOSIS — I5020 Unspecified systolic (congestive) heart failure: Secondary | ICD-10-CM | POA: Insufficient documentation

## 2021-02-01 DIAGNOSIS — R69 Illness, unspecified: Secondary | ICD-10-CM | POA: Diagnosis not present

## 2021-02-01 DIAGNOSIS — E790 Hyperuricemia without signs of inflammatory arthritis and tophaceous disease: Secondary | ICD-10-CM | POA: Diagnosis not present

## 2021-02-01 DIAGNOSIS — E782 Mixed hyperlipidemia: Secondary | ICD-10-CM

## 2021-02-01 DIAGNOSIS — F419 Anxiety disorder, unspecified: Secondary | ICD-10-CM

## 2021-02-01 LAB — BASIC METABOLIC PANEL
Anion gap: 9 (ref 5–15)
BUN: 35 mg/dL — ABNORMAL HIGH (ref 6–23)
BUN: 48 mg/dL — ABNORMAL HIGH (ref 8–23)
CO2: 28 mEq/L (ref 19–32)
CO2: 25 mmol/L (ref 22–32)
Calcium: 9.3 mg/dL (ref 8.4–10.5)
Calcium: 9.1 mg/dL (ref 8.9–10.3)
Chloride: 102 mEq/L (ref 96–112)
Chloride: 103 mmol/L (ref 98–111)
Creatinine, Ser: 1.33 mg/dL (ref 0.40–1.50)
Creatinine, Ser: 1.83 mg/dL — ABNORMAL HIGH (ref 0.61–1.24)
GFR, Estimated: 39 mL/min — ABNORMAL LOW (ref >60)
GFR: 53.9 mL/min — ABNORMAL LOW (ref >60.00)
Glucose, Bld: 90 mg/dL (ref 70–99)
Glucose, Bld: 91 mg/dL (ref 70–99)
Potassium: 5.4 mEq/L — ABNORMAL HIGH (ref 3.5–5.1)
Potassium: 4.7 mmol/L (ref 3.5–5.1)
Sodium: 138 mEq/L (ref 135–145)
Sodium: 137 mmol/L (ref 135–145)

## 2021-02-01 LAB — URIC ACID: Uric Acid, Serum: 9 mg/dL — ABNORMAL HIGH (ref 4.0–7.8)

## 2021-02-01 NOTE — Assessment & Plan Note (Signed)
Controlled. Continue crestor 5mg 

## 2021-02-01 NOTE — Progress Notes (Signed)
Subjective:    Patient ID: Samuel Gallegos, male    DOB: 1951/03/21, 69 y.o.   MRN: [id number]  CC: Samuel Gallegos is a 70 y.o. male who presents today for follow up.   HPI: Feels well today No complaints  Remains active in farming, planting and singing in church choir. He feels his energy and breathing have improved. No LE edema, orthopnea, cp.  Feels well on paxil and that dose is adequate.  No left toe pain. No open wound in toe.  He has seen Samuel Gallegos DPM for suspected left acute gout attack in great toe complicated by previous fracture. Given steroid injection 2 weeks ago.  Crt 1.43  HFrEF - following with Dr Samuel Gallegos ; entresto increased to 97/103.  He is compliant with demadex 20mg , toprol 12.5mg . Follow up with dr next week  HLD- compliant with crestor 5mg .       HISTORY:  History reviewed. No pertinent past medical history. Past Surgical History:  Procedure Laterality Date  . HERNIA REPAIR  1953   Family History  Problem Relation Age of Onset  . Cancer Mother   . Hypertension Father     Allergies: Amoxicillin Current Outpatient Medications on File Prior to Visit  Medication Sig Dispense Refill  . aspirin EC 81 MG tablet Take 1 tablet (81 mg total) by mouth daily. Swallow whole. 90 tablet 3  . metoprolol succinate (TOPROL XL) 25 MG 24 hr tablet Take 0.5 tablets (12.5 mg total) by mouth daily. 45 tablet 3  . PARoxetine (PAXIL) 20 MG tablet Take 1 tablet (20 mg total) by mouth daily. 90 tablet 3  . rosuvastatin (CRESTOR) 5 MG tablet Take 1 tablet (5 mg total) by mouth daily. 90 tablet 3  . sacubitril-valsartan (ENTRESTO) 97-103 MG Take 1 tablet by mouth 2 (two) times daily. 180 tablet 3  . torsemide (DEMADEX) 20 MG tablet Take 1 tablet (20 mg total) by mouth daily. 30 tablet 0   No current facility-administered medications on file prior to visit.    Social History   Tobacco Use  . Smoking status: Never Smoker  . Smokeless tobacco: Never Used   Vaping Use  . Vaping Use: Never used  Substance Use Topics  . Alcohol use: Never  . Drug use: Never    Review of Systems  Constitutional: Negative for chills and fever.  Respiratory: Negative for cough.   Cardiovascular: Negative for chest pain and palpitations.  Gastrointestinal: Negative for nausea and vomiting.      Objective:    BP 130/70   Pulse (!) 54   Temp 97.6 F (36.4 C)   Ht 6\' 5"  (1.956 m)   Wt (!) 332 lb 12.8 oz (151 kg)   SpO2 98%   BMI 39.46 kg/m  BP Readings from Last 3 Encounters:  02/01/21 130/70  01/14/21 112/68  11/08/20 (!) 154/60   Wt Readings from Last 3 Encounters:  02/01/21 (!) 332 lb 12.8 oz (151 kg)  01/13/21 (!) 327 lb (148.3 kg)  11/08/20 (!) 330 lb (149.7 kg)    Physical Exam Vitals reviewed.  Constitutional:      Appearance: He is well-developed.  Cardiovascular:     Rate and Rhythm: Regular rhythm.     Heart sounds: Normal heart sounds.  Pulmonary:     Effort: Pulmonary effort is normal. No respiratory distress.     Breath sounds: Normal breath sounds. No wheezing, rhonchi or rales.  Skin:    General: Skin is warm and dry.  Neurological:     Mental Status: He is alert.  Psychiatric:        Speech: Speech normal.        Behavior: Behavior normal.        Assessment & Plan:   Problem List Items Addressed This Visit      Cardiovascular and Mediastinum   HFrEF (heart failure with reduced ejection fraction) (HCC) - Primary    Symptomatically stable. Tolerating increased entresto well. Continue entresto 97/103,demadex 20mg , toprol 12.5mg . Pending BMP due to recent increase in Crt although likely secondary to dehydration.       Relevant Orders   Basic metabolic panel     Other   Anxiety    Controlled. Continue paxil 20mg .      Elevated uric acid in blood   Relevant Orders   Uric acid   Mixed hyperlipidemia    Controlled. Continue crestor 5mg           I am having Samuel Gallegos maintain his PARoxetine,  rosuvastatin, aspirin EC, metoprolol succinate, Entresto, and torsemide.   No orders of the defined types were placed in this encounter.   Return precautions given.   Risks, benefits, and alternatives of the medications and treatment plan prescribed today were discussed, and patient expressed understanding.   Education regarding symptom management and diagnosis given to patient on AVS.  Continue to follow with , FNP for routine health maintenance.   and I agreed with plan.   , FNP

## 2021-02-01 NOTE — Patient Instructions (Signed)
Nice to see you!   

## 2021-02-01 NOTE — Assessment & Plan Note (Signed)
Controlled. Continue paxil 20mg .

## 2021-02-01 NOTE — Assessment & Plan Note (Signed)
Symptomatically stable. Tolerating increased entresto well. Continue entresto 97/103,demadex 20mg , toprol 12.5mg . Pending BMP due to recent increase in Crt although likely secondary to dehydration.

## 2021-02-01 NOTE — Progress Notes (Signed)
Spoke with pt He feels well No cp No potassium supplements  He is willing to have potassium rechecked tonight.  He will go to medical mall Suspect entresto contributory

## 2021-02-02 ENCOUNTER — Other Ambulatory Visit: Payer: Self-pay

## 2021-02-02 MED ORDER — ALLOPURINOL 100 MG PO TABS: 100.0000 mg | ORAL_TABLET | Freq: Every day | ORAL | 1 refills | Status: DC

## 2021-02-03 ENCOUNTER — Other Ambulatory Visit: Payer: Self-pay | Admitting: Family

## 2021-02-04 ENCOUNTER — Other Ambulatory Visit: Payer: Self-pay | Admitting: Family

## 2021-02-04 DIAGNOSIS — F419 Anxiety disorder, unspecified: Secondary | ICD-10-CM

## 2021-02-06 NOTE — Progress Notes (Signed)
Cardiology Office Note  Date:  02/07/2021   ID:  Samuel Gallegos, DOB 1951-04-17, MRN [id number]  PCP:  Allegra Grana, FNP   Chief Complaint  Patient presents with  . 3 month follow up     "doing well." Medications reviewed by the patient verbally.     HPI:  Samuel Gallegos is a 70 y.o. 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 Left ventricular ejection fraction, by estimation, is 20 to 25%. The  left ventricle has severely decreased function. The left ventricle  demonstrates global hypokinesis. The left ventricular internal cavity size  was severely dilated. Left ventricular  diastolic parameters are consistent with Grade II diastolic dysfunction  (pseudonormalization).  2. Right ventricular systolic function is normal. The right ventricular  size is normal. There is moderately elevated pulmonary artery systolic  pressure. The estimated right ventricular systolic pressure is 47.9 mmHg.  3. Left atrial size was moderately dilated.  4. Mild to moderate mitral valve regurgitation.  5.  Aortic valve regurgitation is  mild to  moderate.   Cardiac CTA, results reviewed with him again today 1. Minimal to mild non-obstructive CAD,   Atherosclerosis primarily of the proximal LAD and RCA arteries. 2. Coronary calcium score of 393. 3. Normal coronary origin with right dominance. 6. Findings consistent with non-ischemic cardiomyopathy   PMH:   has no past medical history on file.  PSH:    Past Surgical History:  Procedure Laterality Date  . HERNIA REPAIR  1953    Current Outpatient Medications  Medication Sig Dispense Refill  . allopurinol (ZYLOPRIM) 100 MG tablet Take 1 tablet (100 mg total) by mouth daily. 90 tablet 1  . aspirin EC 81 MG tablet Take 1 tablet (81 mg total) by mouth daily. Swallow whole. 90 tablet 3  . metoprolol succinate (TOPROL XL) 25 MG 24 hr tablet Take 0.5 tablets (12.5 mg total) by mouth daily. 45 tablet 3  . PARoxetine (PAXIL) 20 MG tablet TAKE 1 TABLET BY MOUTH EVERY DAY 90 tablet 3  . rosuvastatin (CRESTOR) 5 MG tablet Take 1 tablet (5 mg total) by mouth daily. 90 tablet 3  . sacubitril-valsartan (ENTRESTO) 97-103 MG Take 1 tablet by mouth 2 (two) times daily. 180 tablet 3  . torsemide (DEMADEX) 20 MG tablet TAKE 1 TABLET BY MOUTH EVERY DAY 30 tablet 0   No current facility-administered medications for this visit.    Allergies:   Amoxicillin   Social History:  The patient  reports that he has never smoked. He has never used smokeless tobacco. He reports that he does not drink alcohol and does not use drugs.   Family History:   family history includes  Cancer in his mother; Hypertension in his father.    Review of Systems: Review of Systems  Constitutional: Negative.   HENT: Negative.   Respiratory: Negative.   Cardiovascular: Negative.   Gastrointestinal: Negative.   Musculoskeletal: Negative.   Neurological: Negative.   Psychiatric/Behavioral: Negative.   All other systems reviewed and are negative.   PHYSICAL EXAM: VS:  BP (!) 144/68 (BP Location: Left Arm, Patient  Position: Sitting, Cuff Size: Normal)   Pulse (!) 51   Ht 6\' 4"  (1.93 m)   Wt (!) 334 lb 4 oz (151.6 kg)   SpO2 98%   BMI 40.69 kg/m  , BMI Body mass index is 40.69 kg/m. Constitutional:  oriented to person, place, and time. No distress.  HENT:  Head: Grossly normal Eyes:  no discharge. No scleral icterus.  Neck: No JVD, no carotid bruits  Cardiovascular: Regular rate and rhythm, no murmurs appreciated Pulmonary/Chest: Clear to auscultation bilaterally, no wheezes or rails Abdominal: Soft.  no distension.  no tenderness.  Musculoskeletal: Normal range of motion Neurological:  normal muscle tone. Coordination normal. No atrophy Skin: Skin warm and dry Psychiatric: normal affect, pleasant   Recent Labs: 02/18/2020: TSH 1.63 01/14/2021: ALT 11; Hemoglobin 13.8; Platelets 199 02/01/2021: BUN 48; Creatinine, Ser 1.83; Potassium 4.7; Sodium 137    Lipid Panel Lab Results  Component Value Date   CHOL 146 10/03/2020   HDL 36.50 (L) 10/03/2020   LDLCALC 92 10/03/2020   TRIG 89.0 10/03/2020      Wt Readings from Last 3 Encounters:  02/07/21 (!) 334 lb 4 oz (151.6 kg)  02/01/21 (!) 332 lb 12.8 oz (151 kg)  01/13/21 (!) 327 lb (148.3 kg)     ASSESSMENT AND PLAN:  Problem List Items Addressed This Visit      Cardiology Problems   Mixed hyperlipidemia   HFrEF (heart failure with reduced ejection fraction) (HCC)   CAD (coronary artery disease) - Primary   Mild dilation of ascending aorta (HCC)   Valvular heart disease     Nonischemic cardiomyopathy Concerning for chronic hypertensive heart disease Continue metoprolol at current dose  Entresto  97/103 mg p.o. 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 [facility] [place], [place]

## 2021-02-07 ENCOUNTER — Other Ambulatory Visit: Payer: Self-pay

## 2021-02-07 ENCOUNTER — Encounter: Payer: Self-pay | Admitting: Cardiovascular Disease

## 2021-02-07 ENCOUNTER — Ambulatory Visit: Payer: Medicare HMO | Admitting: Cardiovascular Disease

## 2021-02-07 VITALS — BP 144/68 | HR 51 | Ht 76.0 in | Wt 334.2 lb

## 2021-02-07 DIAGNOSIS — I25118 Atherosclerotic heart disease of native coronary artery with other forms of angina pectoris: Secondary | ICD-10-CM

## 2021-02-07 DIAGNOSIS — I38 Endocarditis, valve unspecified: Secondary | ICD-10-CM

## 2021-02-07 DIAGNOSIS — E782 Mixed hyperlipidemia: Secondary | ICD-10-CM | POA: Diagnosis not present

## 2021-02-07 DIAGNOSIS — I429 Cardiomyopathy, unspecified: Secondary | ICD-10-CM

## 2021-02-07 DIAGNOSIS — I77810 Thoracic aortic ectasia: Secondary | ICD-10-CM

## 2021-02-07 DIAGNOSIS — I5020 Unspecified systolic (congestive) heart failure: Secondary | ICD-10-CM

## 2021-02-07 MED ORDER — TADALAFIL 20 MG PO TABS: 20.0000 mg | ORAL_TABLET | Freq: Every day | ORAL | 1 refills | Status: DC | PRN

## 2021-02-07 NOTE — Patient Instructions (Addendum)
Medication Instructions:  No changes  If you need a refill on your cardiac medications before your next appointment, please call your pharmacy.    Lab work: No new labs needed  Testing/Procedures: ECHO (cardiomyopathy)  Your physician has requested that you have an echocardiogram. Echocardiography is a painless test that uses sound waves to create images of your heart. It provides your doctor with information about the size and shape of your heart and how well your heart's chambers and valves are working. This procedure takes approximately one hour. There are no restrictions for this procedure.  There is a possibility that an IV may need to be started during your test to inject an image enhancing agent. This is done to obtain more optimal pictures of your heart. Therefore we ask that you do at least drink some water prior to coming in to hydrate your veins.     Follow-Up:  . You will need a follow up appointment in 5 months  . Providers on your designated Care Team:   . Nicolasa Ducking, NP . Eula Listen, PA-C . Marisue Ivan, PA-C  COVID-19 Vaccine Information can be found at: [email or web address] For questions related to vaccine distribution or appointments, please email [email or web address] or call [phone].

## 2021-02-08 ENCOUNTER — Other Ambulatory Visit
Admission: RE | Admit: 2021-02-08 | Discharge: 2021-02-08 | Disposition: A | Discharge status: Home / Self Care | Payer: Medicare HMO | Attending: Cardiovascular Disease | Admitting: Cardiovascular Disease

## 2021-02-08 ENCOUNTER — Telehealth: Payer: Self-pay

## 2021-02-08 DIAGNOSIS — Z79899 Other long term (current) drug therapy: Secondary | ICD-10-CM | POA: Insufficient documentation

## 2021-02-08 DIAGNOSIS — I5020 Unspecified systolic (congestive) heart failure: Secondary | ICD-10-CM

## 2021-02-08 LAB — BASIC METABOLIC PANEL
Anion gap: 9 (ref 5–15)
BUN: 31 mg/dL — ABNORMAL HIGH (ref 8–23)
CO2: 23 mmol/L (ref 22–32)
Calcium: 8.8 mg/dL — ABNORMAL LOW (ref 8.9–10.3)
Chloride: 102 mmol/L (ref 98–111)
Creatinine, Ser: 1.38 mg/dL — ABNORMAL HIGH (ref 0.61–1.24)
GFR, Estimated: 55 mL/min — ABNORMAL LOW (ref >60)
Glucose, Bld: 105 mg/dL — ABNORMAL HIGH (ref 70–99)
Potassium: 4.4 mmol/L (ref 3.5–5.1)
Sodium: 134 mmol/L — ABNORMAL LOW (ref 135–145)

## 2021-02-08 NOTE — Telephone Encounter (Signed)
Reached out to pt regarding repeat BMP per Dr. Mariah Milling d/t discretion from [facility] on 4/20 two encounters with different results. Pt verbalized understanding, reports will go today to have done, advised no rush, Mr. Nied stated he was in the area and would have it drawn while he was out.  Advised Walk into medical mall at the check in desk, they will direct you to lab registration, hours for labs are Monday-Friday 07:00am-5:30pm (no appointment necessary).

## 2021-02-26 ENCOUNTER — Other Ambulatory Visit: Payer: Self-pay | Admitting: Family

## 2021-03-06 ENCOUNTER — Other Ambulatory Visit: Payer: Self-pay | Admitting: Family

## 2021-03-06 DIAGNOSIS — E785 Hyperlipidemia, unspecified: Secondary | ICD-10-CM

## 2021-03-14 ENCOUNTER — Telehealth: Payer: Self-pay

## 2021-03-14 NOTE — Telephone Encounter (Signed)
    COVID-19 Pre-Screening Questions V2.:  . In the past 7 to 10 days have you had a cough,  shortness of breath, headache, congestion, fever (100 or greater) body aches, chills, sore throat, or sudden loss of taste or sense of smell,? Yes cough but symptoms have improved  . Have you been around anyone with known Covid 19, or who is waiting for a Covid test result and is symptomatic? Yes, self   For patients who are Covid+, pending results or exposed in the last 5 days WITH SYMPTOMS:  1.       Offer to change appointment to a [facility] appointment. If the patient declines, contact covering nurse or triage so it can be determined if patient needs to be seen or     rescheduled. (assumes patient calls ahead)  2.       If a patient presents in the lobby, ask them to return to their car and the nurse will call them. Obtain the correct cell phone number and message the covering nurse. The nurse will triage the patient and discuss with the provider to determine appropriate visit plan (In office, MyChart or reschedule).   3.       If it is determined the patient needs to be seen in the office, arrangements will be made for the patient to be seen in a remote room with minimal touches. (Location will be         specific to each [facility] site).               If you have any concerns/questions about symptoms patients report during screening (either on the phone or at threshold),                Send a secure chat with the patient's chart to the APP doing preop clearances for that day.              If the decision is made to see the patient, document the following in the appt. note:  "cleared by APP's initials".                        If an APP is not available contact a member of the leadership team.   Patients who are Covid+ are allowed in the office when the following are met: 1. No symptoms or improving symptoms  2. At least 5 days post positive test

## 2021-03-14 NOTE — Telephone Encounter (Signed)
Per policy below ok to keep patient appt as scheduled.  It has been greater than 5 days from positive covid home test on 03/09/21 and patient symptoms have improved .  Patient reports only symptom is cough at this time and he feels overall improvement.   Patient advised to keep appt 6/3 Echo .

## 2021-03-17 ENCOUNTER — Other Ambulatory Visit: Payer: Self-pay

## 2021-03-17 ENCOUNTER — Ambulatory Visit (INDEPENDENT_AMBULATORY_CARE_PROVIDER_SITE_OTHER): Payer: Medicare HMO

## 2021-03-17 DIAGNOSIS — I517 Cardiomegaly: Secondary | ICD-10-CM | POA: Diagnosis not present

## 2021-03-17 DIAGNOSIS — I429 Cardiomyopathy, unspecified: Secondary | ICD-10-CM | POA: Diagnosis not present

## 2021-03-17 DIAGNOSIS — I77810 Thoracic aortic ectasia: Secondary | ICD-10-CM | POA: Diagnosis not present

## 2021-03-17 MED ORDER — PERFLUTREN LIPID MICROSPHERE
1.0000 mL | INTRAVENOUS | Status: AC | PRN
  Administered 2021-03-17: 2 mL via INTRAVENOUS

## 2021-03-21 ENCOUNTER — Telehealth: Payer: Self-pay | Admitting: Cardiovascular Disease

## 2021-03-21 NOTE — Telephone Encounter (Signed)
Pt utilizes [employer] stating results will be called once read and interpreted.  Dr. Mariah Milling on vacation, will return 6/13

## 2021-03-21 NOTE — Telephone Encounter (Signed)
Patient calling back in for echo results from 6/3  Please advise

## 2021-03-27 ENCOUNTER — Other Ambulatory Visit: Payer: Self-pay | Admitting: Cardiovascular Disease

## 2021-03-27 LAB — ECHOCARDIOGRAM COMPLETE
AR max vel: 4.52 cm2
AV Area VTI: 4.76 cm2
AV Area mean vel: 4.15 cm2
AV Mean grad: 10 mmHg
AV Peak grad: 17.5 mmHg
Ao pk vel: 2.09 m/s
Area-P 1/2: 2.17 cm2
P 1/2 time: 669 msec
S' Lateral: 4.8 cm
Single Plane A4C EF: 62.3 %

## 2021-04-24 ENCOUNTER — Telehealth: Payer: Self-pay | Admitting: Cardiovascular Disease

## 2021-04-24 NOTE — Telephone Encounter (Signed)
STAT if patient feels like he/she is going to faint   Are you dizzy now? No, just when getting up from his recliner - feels like he has to grab onto something   Do you feel faint or have you passed out? no  Do you have any other symptoms? no  Have you checked your HR and BP (record if available)? BP is 134/56 - thinks it might be a little low

## 2021-04-25 NOTE — Telephone Encounter (Signed)
Was able to return call to Samuel Gallegos, regarding dizziness when getting up, Samuel Gallegos reports he "feels fine, I think that lady who answer the phone yesterday was confused on what I was trying to say". He reports he "only get dizzy for 2 secs when I been recline in the recliner too long and I get up too fast, but it only last for 2 seconds". Advised pt may be d/t blood rushing back through the body after being in a reclined position for too long, sudden/fast movements can cause short spells of dizziness. Pt agrees and stated, he could sit up slowly and sit for a 2-5 mins before trying to get up.  Pt reports called in to discuss his BP of 134/56, stated he wanted to make sure the DBP was "okay", as the SBP was "normal" for him. Advised typically DBP normal range in less then 80 mmhg, the DBP is the pressure from his blood exerting against his artery walls while his heart is resting between beats. Number of 56 DBP is okay given his cardiac history and the medications for his BP he is currently taking. Advised if his DBP dips down below 50s and ranging in the 40s or 30s, then may be alarming.   Advised to monitor BP, move slow when changing positions from the recliner, Samuel Gallegos verbalized understanding, is grateful for the return call and the information on his BP, otherwise questions were address and no additional concerns at this time, will call back for anything further.

## 2021-05-02 ENCOUNTER — Telehealth: Payer: Self-pay | Admitting: Cardiovascular Disease

## 2021-05-02 NOTE — Telephone Encounter (Signed)
Spoke with wife Thurston Hole (DPR approved), advised on PA application, reviewed process with her and is agreeable to start application. Will mail application to her today and she will return application with pt's sections completed.   No samples at this time to give out to pt, currently in the "donut hole".   Will fax PA for Entresto once application is complete and all required documents are with the application.

## 2021-05-02 NOTE — Telephone Encounter (Signed)
He can apply for patient assistance through [employer]

## 2021-05-02 NOTE — Telephone Encounter (Signed)
Patient spouse came by office  States patient is in donut hole and would like to know what options they have for getting Entresto Please call spouse, she is on [facility]

## 2021-06-14 ENCOUNTER — Other Ambulatory Visit: Payer: Self-pay | Admitting: Cardiovascular Disease

## 2021-07-09 NOTE — Progress Notes (Signed)
Cardiology Office Note  Date:  07/11/2021   ID:  Samuel Gallegos, DOB 1951/10/10, MRN [id number]  PCP:  Allegra Grana, FNP   Chief Complaint  Patient presents with   5 month follow up     "Doing well." Medications reviewed by the patient verbally.     HPI:  Samuel Gallegos is a 70 y.o. 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 Left ventricular ejection fraction, by estimation, is 20 to 25%. The  left ventricle has severely decreased function. The left ventricle  demonstrates global hypokinesis. The left ventricular internal cavity size  was severely dilated. Left ventricular  diastolic parameters are consistent with Grade II diastolic dysfunction  (pseudonormalization).   2. Right ventricular systolic function is normal. The right ventricular  size is normal. There is moderately elevated pulmonary artery systolic  pressure. The estimated right ventricular systolic pressure is 47.9 mmHg.   3. Left atrial size was moderately dilated.   4. Mild to moderate mitral valve regurgitation.   5.  Aortic valve regurgitation is  mild to moderate.   Cardiac CTA,   1. Minimal to mild non-obstructive CAD,   Atherosclerosis primarily of the proximal LAD and RCA arteries. 2. Coronary calcium score of 393. 3. Normal coronary origin with right dominance. 6. Findings consistent with non-ischemic cardiomyopathy   PMH:   has no past medical history on file.  PSH:    Past Surgical History:  Procedure Laterality Date   HERNIA REPAIR  1953    Current Outpatient Medications  Medication Sig Dispense Refill   allopurinol (ZYLOPRIM) 100 MG tablet TAKE 1 TABLET BY MOUTH EVERY DAY 90 tablet 1   aspirin EC 81 MG tablet Take 1 tablet (81 mg total) by mouth daily. Swallow whole. 90 tablet 3   PARoxetine (PAXIL) 20 MG tablet TAKE 1 TABLET BY MOUTH EVERY DAY 90 tablet 3   rosuvastatin (CRESTOR) 5 MG tablet TAKE 1 TABLET BY MOUTH EVERY DAY 90 tablet 3   tadalafil (CIALIS) 20 MG tablet Take 1 tablet (20 mg total) by mouth daily as needed for erectile dysfunction. 30 tablet 1   metoprolol succinate (TOPROL-XL) 25 MG 24 hr tablet Take 0.5 tablets (12.5 mg total) by mouth daily. 45 tablet 3   sacubitril-valsartan (ENTRESTO) 97-103 MG Take 1 tablet by mouth 2 (two) times daily. 60 tablet 11   torsemide (DEMADEX) 20 MG tablet Take 1 tablet (20 mg total) by mouth daily. 90 tablet 3   No current facility-administered medications for this visit.    Allergies:   Amoxicillin   Social History:  The patient  reports that he has never smoked. He has never used smokeless tobacco. He reports that he does not drink alcohol  and does not use drugs.   Family History:   family history includes Cancer in his mother; Hypertension in his father.    Review of Systems: Review of Systems  Constitutional: Negative.   HENT: Negative.    Respiratory: Negative.    Cardiovascular: Negative.   Gastrointestinal: Negative.   Musculoskeletal: Negative.   Neurological: Negative.   Psychiatric/Behavioral: Negative.    All other systems reviewed and are negative.  PHYSICAL EXAM: VS:  BP  138/60 (BP Location: Left Arm, Patient Position: Sitting, Cuff Size: Normal)   Pulse 65   Ht 6\' 4"  (1.93 m)   Wt (!) 325 lb 6 oz (147.6 kg)   SpO2 97%   BMI 39.61 kg/m  , BMI Body mass index is 39.61 kg/m. Constitutional:  oriented to person, place, and time. No distress.  HENT:  Head: Grossly normal Eyes:  no discharge. No scleral icterus.  Neck: No JVD, no carotid bruits  Cardiovascular: Regular rate and rhythm, no murmurs appreciated Pulmonary/Chest: Clear to auscultation bilaterally, no wheezes or rails Abdominal: Soft.  no distension.  no tenderness.  Musculoskeletal: Normal range of motion Neurological:  normal muscle tone. Coordination normal. No atrophy Skin: Skin warm and dry Psychiatric: normal affect, pleasant   Recent Labs: 01/14/2021: ALT 11; Hemoglobin 13.8; Platelets 199 02/08/2021: BUN 31; Creatinine, Ser 1.38; Potassium 4.4; Sodium 134    Lipid Panel Lab Results  Component Value Date   CHOL 146 10/03/2020   HDL 36.50 (L) 10/03/2020   LDLCALC 92 10/03/2020   TRIG 89.0 10/03/2020      Wt Readings from Last 3 Encounters:  07/11/21 (!) 325 lb 6 oz (147.6 kg)  07/10/21 (!) 334 lb (151.5 kg)  02/07/21 (!) 334 lb 4 oz (151.6 kg)     ASSESSMENT AND PLAN:  Problem List Items Addressed This Visit       Cardiology Problems   Mixed hyperlipidemia   Relevant Medications   metoprolol succinate (TOPROL-XL) 25 MG 24 hr tablet   torsemide (DEMADEX) 20 MG tablet   sacubitril-valsartan (ENTRESTO) 97-103 MG   HFrEF (heart failure with reduced ejection fraction) (HCC) - Primary   Relevant Medications   metoprolol succinate (TOPROL-XL) 25 MG 24 hr tablet   torsemide (DEMADEX) 20 MG tablet   sacubitril-valsartan (ENTRESTO) 97-103 MG   Other Relevant Orders   EKG 12-Lead   Basic metabolic panel   CAD (coronary artery disease)   Relevant Medications   metoprolol succinate (TOPROL-XL) 25 MG 24 hr tablet   torsemide (DEMADEX) 20 MG tablet   sacubitril-valsartan  (ENTRESTO) 97-103 MG   Other Relevant Orders   EKG 12-Lead   Valvular heart disease   Relevant Medications   metoprolol succinate (TOPROL-XL) 25 MG 24 hr tablet   torsemide (DEMADEX) 20 MG tablet   sacubitril-valsartan (ENTRESTO) 97-103 MG   Other Visit Diagnoses     Dyspnea on exertion       Relevant Orders   Basic metabolic panel   Medication management       Relevant Orders   Basic metabolic panel     Nonischemic cardiomyopathy Concerning for chronic hypertensive heart disease Continue metoprolol , Entresto  97/103 mg p.o. twice daily,torsemide Review of lab work shows borderline elevated potassium, will not add spironolactone He is in the donut hole spending $170 per month on Entresto, we will hold off on adding SGLT2 inhibitor Consider adding next year but he is concerned about the price Unable to advance beta-blocker given bradycardia  Acute on chronic renal dysfunction  BMP today  Coronary calcification Continue Crestor goal LDL less than 70  Morbid obesity BMI close to 40 We have encouraged continued exercise, careful diet management in an effort to lose weight.     Total encounter time more than 25 minutes  Greater than 50% was spent in counseling and coordination of care with the patient    Signed, Dossie Arbour, M.D., Ph.D. [facility] [place], [place] [phone]

## 2021-07-10 ENCOUNTER — Other Ambulatory Visit: Payer: Self-pay | Admitting: Family

## 2021-07-10 ENCOUNTER — Ambulatory Visit (INDEPENDENT_AMBULATORY_CARE_PROVIDER_SITE_OTHER): Payer: Medicare HMO

## 2021-07-10 ENCOUNTER — Other Ambulatory Visit: Payer: Self-pay | Admitting: Cardiovascular Disease

## 2021-07-10 VITALS — Ht 76.0 in | Wt 334.0 lb

## 2021-07-10 DIAGNOSIS — Z0000 Encounter for general adult medical examination without abnormal findings: Secondary | ICD-10-CM | POA: Diagnosis not present

## 2021-07-10 NOTE — Progress Notes (Signed)
Subjective:   Samuel Gallegos is a 70 y.o. male who presents for Medicare Annual/Subsequent preventive examination.  Review of Systems    No ROS.  Medicare Wellness Virtual Visit.  Visual/audio telehealth visit, UTA vital signs.   See social history for additional risk factors.   Cardiac Risk Factors include: advanced age (>61men, >2 women);hypertension;male gender     Objective:    Today's Vitals   07/10/21 1348  Weight: (!) 334 lb (151.5 kg)  Height: 6\' 4"  (1.93 m)   Body mass index is 40.66 kg/m.  Advanced Directives 01/13/2021 07/07/2020 06/24/2019  Does Patient Have a Medical Advance Directive? No No No  Would patient like information on creating a medical advance directive? No - Patient declined No - Patient declined No - Patient declined    Current Medications (verified) Outpatient Encounter Medications as of 07/10/2021  Medication Sig   allopurinol (ZYLOPRIM) 100 MG tablet Take 1 tablet (100 mg total) by mouth daily.   aspirin EC 81 MG tablet Take 1 tablet (81 mg total) by mouth daily. Swallow whole.   ENTRESTO 97-103 MG TAKE 1 TABLET BY MOUTH TWICE A DAY   metoprolol succinate (TOPROL-XL) 25 MG 24 hr tablet TAKE 1/2 TABLET BY MOUTH EVERY DAY   PARoxetine (PAXIL) 20 MG tablet TAKE 1 TABLET BY MOUTH EVERY DAY   rosuvastatin (CRESTOR) 5 MG tablet TAKE 1 TABLET BY MOUTH EVERY DAY   tadalafil (CIALIS) 20 MG tablet Take 1 tablet (20 mg total) by mouth daily as needed for erectile dysfunction.   torsemide (DEMADEX) 20 MG tablet TAKE 1 TABLET BY MOUTH EVERY DAY   No facility-administered encounter medications on file as of 07/10/2021.    Allergies (verified) Amoxicillin   History: History reviewed. No pertinent past medical history. Past Surgical History:  Procedure Laterality Date   HERNIA REPAIR  1953   Family History  Problem Relation Age of Onset   Cancer Mother    Hypertension Father    Social History   Socioeconomic History   Marital status: Married     Spouse name: Not on file   Number of children: Not on file   Years of education: Not on file   Highest education level: Not on file  Occupational History   Not on file  Tobacco Use   Smoking status: Never   Smokeless tobacco: Never  Vaping Use   Vaping Use: Never used  Substance and Sexual Activity   Alcohol use: Never   Drug use: Never   Sexual activity: Not on file  Other Topics Concern   Not on file  Social History Narrative   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 , drives truck as well   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   Married   Social Determinants of Health   Financial Resource Strain: Low Risk    Difficulty of Paying Living Expenses: Not hard at all  Food Insecurity: No Food Insecurity   Worried About [name] in the Last Year: Never true   Programme researcher, broadcasting/film/video in the Last Year: Never true  Transportation Needs: No Transportation Needs   Lack of Transportation (Medical): No   Lack of Transportation (Non-Medical): No  Physical Activity: Not on file  Stress: No Stress Concern Present   Feeling of Stress : Not at all  Social Connections: Unknown   Frequency of Communication with Friends and Family: Not on file   Frequency of Social Gatherings with Friends and Family: Not on file   Attends Religious Services: Not on file  Active Member of Clubs or Organizations: Not on file   Attends Banker Meetings: Not on file   Marital Status: Married    Tobacco Counseling Counseling given: Not Answered   Clinical Intake:  Pre-visit preparation completed: Yes        Diabetes: No  How often do you need to have someone help you when you read instructions, pamphlets, or other written materials from your doctor or pharmacy?: 1 - Never    Interpreter Needed?: No      Activities of Daily Living In your present state of health, do you have any difficulty performing the following activities: 07/10/2021 01/13/2021  Hearing? N N  Vision? N N  Difficulty concentrating or making  decisions? N N  Walking or climbing stairs? N N  Dressing or bathing? N N  Doing errands, shopping? N N  Preparing Food and eating ? N -  Using the Toilet? N -  In the past six months, have you accidently leaked urine? N -  Do you have problems with loss of bowel control? N -  Managing your Medications? N -  Managing your Finances? N -  Housekeeping or managing your Housekeeping? N -  Some recent data might be hidden    Patient Care Team: Allegra Grana, FNP as PCP - General (Family Medicine) Antonieta Iba, MD as PCP - Cardiology (Cardiology)  Indicate any recent Medical Services you may have received from other than [facility] providers in the past year (date may be approximate).     Assessment:   This is a routine wellness examination for [name].  I connected with Samuel Gallegos today by telephone and verified that I am speaking with the correct person using two identifiers. Location patient: home Location provider: work Persons participating in the virtual visit: patient, Engineer, civil (consulting).    I discussed the limitations, risks, security and privacy concerns of performing an evaluation and management service by telephone and the availability of in person appointments. The patient expressed understanding and verbally consented to this telephonic visit.    Interactive audio and video telecommunications were attempted between this provider and patient, however failed, due to patient having technical difficulties OR patient did not have access to video capability.  We continued and completed visit with audio only.  Some vital signs may be absent or patient reported.   Hearing/Vision screen Hearing Screening - Comments:: Patient is able to hear conversational tones without difficulty.  No issues reported. Vision Screening - Comments:: Wears corrective lenses Cataract extraction, bilateral They have seen their ophthalmologist in the last 12 months.    Dietary issues and exercise activities  discussed: Current Exercise Habits: Home exercise routine, Time (Minutes): 30, Frequency (Times/Week): 5, Weekly Exercise (Minutes/Week): 150, Intensity: Mild Regular diet Good water intake   Goals Addressed               This Visit's Progress     Patient Stated     Weight (lb) < 260 lb (117.9 kg) (pt-stated)   334 lb (151.5 kg)     Eat smaller portions Stay active and hydrated       Depression Screen PHQ 2/9 Scores 07/10/2021 07/07/2020 06/03/2020 06/24/2019 04/15/2019 03/05/2019  PHQ - 2 Score 0 0 0 0 0 0  PHQ- 9 Score - - - - 0 0    Fall Risk Fall Risk  07/10/2021 07/07/2020 06/03/2020 02/18/2020 06/24/2019  Falls in the past year? 0 0 0 0 0  Number falls in past yr: -  0 0 - -  Injury with Fall? - - 0 - -  Follow up Falls evaluation completed Falls evaluation completed Falls evaluation completed Falls evaluation completed -    FALL RISK PREVENTION PERTAINING TO THE HOME: Adequate lighting in your home to reduce risk of falls? Yes   ASSISTIVE DEVICES UTILIZED TO PREVENT FALLS: Use of a cane, walker or w/c? No   TIMED UP AND GO: Was the test performed? No .   Cognitive Function: Patient is alert and oriented x3.  Enjoys singing, learning new songs, reading and other brain stimulating activities.      6CIT Screen 06/24/2019  What Year? 0 points  What month? 0 points  What time? 0 points  Count back from 20 0 points  Months in reverse 0 points  Repeat phrase 0 points  Total Score 0    Immunizations Immunization History  Administered Date(s) Administered   Influenza Inj Mdck Quad Pf 07/16/2018   Influenza, High Dose Seasonal PF 07/27/2019   Influenza-Unspecified 07/04/2020   PFIZER(Purple Top)SARS-COV-2 Vaccination 11/26/2019, 12/23/2019, 07/21/2020   Pneumococcal Conjugate-13 08/06/2015   Pneumococcal-Unspecified 07/18/2016   Tdap 07/27/2019   Zoster, Live 08/01/2013   Shingrix vaccine- Due, Education has been provided regarding the importance of this vaccine.  Advised may receive this vaccine at local pharmacy or Health Dept. Aware to provide a copy of the vaccination record if obtained from local pharmacy or Health Dept. Verbalized acceptance and understanding. Deferred.   Health Maintenance Health Maintenance  Topic Date Due   COVID-19 Vaccine (4 - Booster for Pfizer series) 07/26/2021 (Originally 10/13/2020)   Zoster Vaccines- Shingrix (1 of 2) 10/09/2021 (Originally 11/29/1969)   INFLUENZA VACCINE  01/12/2022 (Originally 05/15/2021)   TETANUS/TDAP  07/26/2029   Hepatitis C Screening  Completed   HPV VACCINES  Aged Out   COLONOSCOPY (Pts 45-22yrs Insurance coverage will need to be confirmed)  Discontinued   Lung Cancer Screening: (Low Dose CT Chest recommended if Age 71-80 years, 30 pack-year currently smoking OR have quit w/in 15years.) does not qualify.   Vision Screening: Recommended annual ophthalmology exams for early detection of glaucoma and other disorders of the eye.  Dental Screening: Recommended annual dental exams for proper oral hygiene  Community Resource Referral / Chronic Care Management: CRR required this visit?  No   CCM required this visit?  No      Plan:     I have personally reviewed and noted the following in the patient's chart:   Medical and social history Use of alcohol, tobacco or illicit drugs  Current medications and supplements including opioid prescriptions. Patient is not currently taking opioid prescriptions. Functional ability and status Nutritional status Physical activity Advanced directives List of other physicians Hospitalizations, surgeries, and ER visits in previous 12 months Vitals Screenings to include cognitive, depression, and falls Referrals and appointments  In addition, I have reviewed and discussed with patient certain preventive protocols, quality metrics, and best practice recommendations. A written personalized care plan for preventive services as well as general preventive  health recommendations were provided to patient via mychart.     Ashok Pall, LPN   7/51/0258

## 2021-07-10 NOTE — Patient Instructions (Addendum)
  Samuel Gallegos , Thank you for taking time to come for your Medicare Wellness Visit. I appreciate your ongoing commitment to your health goals. Please review the following plan we discussed and let me know if I can assist you in the future.   These are the goals we discussed:  Goals       Patient Stated     Weight (lb) < 260 lb (117.9 kg) (pt-stated)      Eat smaller portions Stay active and hydrated        This is a list of the screening recommended for you and due dates:  Health Maintenance  Topic Date Due   COVID-19 Vaccine (4 - Booster for Pfizer series) 07/26/2021*   Zoster (Shingles) Vaccine (1 of 2) 10/09/2021*   Flu Shot  01/12/2022*   Tetanus Vaccine  07/26/2029   Hepatitis C Screening: USPSTF Recommendation to screen - Ages 18-79 yo.  Completed   HPV Vaccine  Aged Out   Colon Cancer Screening  Discontinued  *Topic was postponed. The date shown is not the original due date.

## 2021-07-11 ENCOUNTER — Other Ambulatory Visit: Payer: Self-pay

## 2021-07-11 ENCOUNTER — Encounter: Payer: Self-pay | Admitting: Cardiovascular Disease

## 2021-07-11 ENCOUNTER — Ambulatory Visit: Payer: Medicare HMO | Admitting: Cardiovascular Disease

## 2021-07-11 VITALS — BP 138/60 | HR 65 | Ht 76.0 in | Wt 325.4 lb

## 2021-07-11 DIAGNOSIS — R0600 Dyspnea, unspecified: Secondary | ICD-10-CM | POA: Diagnosis not present

## 2021-07-11 DIAGNOSIS — I25118 Atherosclerotic heart disease of native coronary artery with other forms of angina pectoris: Secondary | ICD-10-CM | POA: Diagnosis not present

## 2021-07-11 DIAGNOSIS — I5020 Unspecified systolic (congestive) heart failure: Secondary | ICD-10-CM | POA: Diagnosis not present

## 2021-07-11 DIAGNOSIS — Z79899 Other long term (current) drug therapy: Secondary | ICD-10-CM | POA: Diagnosis not present

## 2021-07-11 DIAGNOSIS — I38 Endocarditis, valve unspecified: Secondary | ICD-10-CM

## 2021-07-11 DIAGNOSIS — R0609 Other forms of dyspnea: Secondary | ICD-10-CM

## 2021-07-11 DIAGNOSIS — E782 Mixed hyperlipidemia: Secondary | ICD-10-CM | POA: Diagnosis not present

## 2021-07-11 MED ORDER — METOPROLOL SUCCINATE ER 25 MG PO TB24: 12.5000 mg | ORAL_TABLET | Freq: Every day | ORAL | 3 refills | Status: DC

## 2021-07-11 MED ORDER — TORSEMIDE 20 MG PO TABS: 20.0000 mg | ORAL_TABLET | Freq: Every day | ORAL | 3 refills | Status: DC

## 2021-07-11 MED ORDER — ENTRESTO 97-103 MG PO TABS: 1.0000 | ORAL_TABLET | Freq: Two times a day (BID) | ORAL | 11 refills | Status: DC

## 2021-07-11 NOTE — Patient Instructions (Addendum)
Medication Instructions:  No changes  If you need a refill on your cardiac medications before your next appointment, please call your pharmacy.   Lab work: [employer] Results will appear on MyChart, abnormal will be called  Testing/Procedures: No new testing needed  Follow-Up: At [facility], you and your health needs are our priority.  As part of our continuing mission to provide you with exceptional heart care, we have created designated Provider Care Teams.  These Care Teams include your primary Cardiologist (physician) and Advanced Practice Providers (APPs -  Physician Assistants and Nurse Practitioners) who all work together to provide you with the care you need, when you need it.  You will need a follow up appointment in 12 months  Providers on your designated Care Team:   Nicolasa Ducking, NP Eula Listen, PA-C Marisue Ivan, PA-C Cadence Imb[place], [place]  COVID-19 Vaccine Information can be found at: [email or web address] For questions related to vaccine distribution or appointments, please email [email or web address] or call [phone].

## 2021-07-12 LAB — BASIC METABOLIC PANEL
BUN/Creatinine Ratio: 14 (ref 10–24)
BUN: 20 mg/dL (ref 8–27)
CO2: 21 mmol/L (ref 20–29)
Calcium: 9.3 mg/dL (ref 8.6–10.2)
Chloride: 102 mmol/L (ref 96–106)
Creatinine, Ser: 1.4 mg/dL — ABNORMAL HIGH (ref 0.76–1.27)
Glucose: 99 mg/dL (ref 70–99)
Potassium: 4.9 mmol/L (ref 3.5–5.2)
Sodium: 142 mmol/L (ref 134–144)
eGFR: 54 mL/min/{1.73_m2} — ABNORMAL LOW (ref >59)

## 2021-08-04 ENCOUNTER — Ambulatory Visit (INDEPENDENT_AMBULATORY_CARE_PROVIDER_SITE_OTHER): Payer: Medicare HMO | Admitting: Family

## 2021-08-04 ENCOUNTER — Other Ambulatory Visit: Payer: Self-pay

## 2021-08-04 ENCOUNTER — Encounter: Payer: Self-pay | Admitting: Family

## 2021-08-04 ENCOUNTER — Telehealth: Payer: Self-pay | Admitting: Family

## 2021-08-04 DIAGNOSIS — F419 Anxiety disorder, unspecified: Secondary | ICD-10-CM

## 2021-08-04 DIAGNOSIS — I5020 Unspecified systolic (congestive) heart failure: Secondary | ICD-10-CM

## 2021-08-04 DIAGNOSIS — R69 Illness, unspecified: Secondary | ICD-10-CM | POA: Diagnosis not present

## 2021-08-04 DIAGNOSIS — E782 Mixed hyperlipidemia: Secondary | ICD-10-CM

## 2021-08-04 NOTE — Telephone Encounter (Signed)
Call pt   I failed to discuss his increase in weight today in visit.   He denied sob,leg swelling today  Does he have scale at home?   He needs to monitor weight and if weight were to increase over dry weight, we may need to increase demadex and consult with cardiology.     Wt Readings from Last 3 Encounters:  08/04/21 (!) 340 lb 8 oz (154.4 kg)  07/11/21 (!) 325 lb 6 oz (147.6 kg)  07/10/21 (!) 334 lb (151.5 kg)

## 2021-08-04 NOTE — Patient Instructions (Signed)
Please be sure to take your blood pressure prior to your doctor's appointments.  Always so nice to see you.   Let me know if you need anything at all.

## 2021-08-04 NOTE — Assessment & Plan Note (Addendum)
Weight is up today.  No overt symptoms of fluid volume overload.  Specifically no shortness of breath, dyspnea on exertion, orthopnea or lower extremity edema.  Advised patient to monitor his weight more closely at home and if weight gain or to continue, would like to assess patient for full fluid volume overload, consider changing Demadex.  Blood pressure improved as patient rested in room.  Advised him to take Entresto, Toprol prior to doctor's appointments so we can assess his blood pressure while he is on his medications.  He verbalized understanding.

## 2021-08-04 NOTE — Assessment & Plan Note (Signed)
Chronic, stable.  LDL less than 100.  Continue Crestor 5 mg

## 2021-08-04 NOTE — Assessment & Plan Note (Signed)
Chronic, stable.  Continue Paxil 20 mg

## 2021-08-04 NOTE — Telephone Encounter (Signed)
I spoke with patient & advised on below. I made sure that he was aware if what "dry weight" is & how to monitor. Since he has a scale he will make sure that he does & if he sees a 3-5 lbs weight gain in the morning he will contact cardiology.

## 2021-08-04 NOTE — Progress Notes (Signed)
Subjective:    Patient ID: Samuel Gallegos, male    DOB: 02-09-1951, 70 y.o.   MRN: [id number]  CC: Samuel Gallegos is a 70 y.o. male who presents today for follow up.   HPI: Feels well today  No new complaints  Anxiety-she is compliant with Paxil 20 mg.  He feels well on this dose.  HTN- compliant with entresto 97/103 BID,demadex 20mg , toprol 12.5mg . He hasnt taken entresto or toprol yet this morning.  He is also not take his Demadex this morning. No leg swelling, chest pain, orthopnea.  Hasnt been checking blood pressure at home.    Hyperlipidemia-compliant with Crestor 5 mg.  Hyperlipidemia-compliant with Crestor orthopnea.  No leg swelling, chest pain.   No SOB. 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   Follow up cardiology 07/11/21- opted not to add on spironlactone, SGLT2 inhibitor  Crt 1.4 three weeks ago  HISTORY:  No past medical history on file. Past Surgical History:  Procedure Laterality Date   HERNIA REPAIR  1953   Family History  Problem Relation Age of Onset   Cancer Mother    Hypertension Father     Allergies: Amoxicillin Current Outpatient Medications on File Prior to Visit  Medication Sig Dispense Refill   allopurinol (ZYLOPRIM) 100 MG tablet TAKE 1 TABLET BY MOUTH EVERY DAY 90 tablet 1   aspirin EC 81 MG tablet Take 1 tablet (81 mg total) by mouth daily. Swallow whole. 90 tablet 3   metoprolol succinate (TOPROL-XL) 25 MG 24 hr tablet Take 0.5 tablets (12.5 mg total) by mouth daily. 45 tablet 3   PARoxetine (PAXIL) 20 MG tablet TAKE 1 TABLET BY MOUTH EVERY DAY 90 tablet 3   rosuvastatin (CRESTOR) 5 MG tablet TAKE 1 TABLET BY MOUTH EVERY DAY 90 tablet 3   sacubitril-valsartan (ENTRESTO) 97-103 MG Take 1 tablet by mouth 2 (two) times daily. 60 tablet 11   tadalafil (CIALIS) 20 MG tablet Take 1 tablet (20 mg total) by mouth daily as needed for erectile dysfunction. 30 tablet 1   torsemide (DEMADEX) 20 MG tablet Take 1 tablet (20 mg total) by  mouth daily. 90 tablet 3   No current facility-administered medications on file prior to visit.    Social History   Tobacco Use   Smoking status: Never   Smokeless tobacco: Never  Vaping Use   Vaping Use: Never used  Substance Use Topics   Alcohol use: Never   Drug use: Never    Review of Systems  Constitutional:  Negative for chills and fever.  Respiratory:  Negative for cough and shortness of breath.   Cardiovascular:  Negative for chest pain, palpitations and leg swelling.  Gastrointestinal:  Negative for nausea and vomiting.     Objective:    BP (!) 150/62   Pulse 95   Temp (!) 96.3 F (35.7 C)   Ht 6' 3.98" (1.93 m)   Wt (!) 340 lb 8 oz (154.4 kg)   SpO2 96%   BMI 41.46 kg/m  BP Readings from Last 3 Encounters:  08/04/21 (!) 150/62  07/11/21 138/60  02/07/21 (!) 144/68   Wt Readings from Last 3 Encounters:  08/04/21 (!) 340 lb 8 oz (154.4 kg)  07/11/21 (!) 325 lb 6 oz (147.6 kg)  07/10/21 (!) 334 lb (151.5 kg)    Physical Exam Vitals reviewed.  Constitutional:      Appearance: He is well-developed.  Cardiovascular:     Rate and Rhythm: Regular rhythm.  Heart sounds: Normal heart sounds.  Pulmonary:     Effort: Pulmonary effort is normal. No respiratory distress.     Breath sounds: Normal breath sounds. No wheezing, rhonchi or rales.  Musculoskeletal:     Right lower leg: No edema.     Left lower leg: No edema.  Skin:    General: Skin is warm and dry.  Neurological:     Mental Status: He is alert.  Psychiatric:        Speech: Speech normal.        Behavior: Behavior normal.       Assessment & Plan:   Problem List Items Addressed This Visit       Cardiovascular and Mediastinum   HFrEF (heart failure with reduced ejection fraction) (HCC)    Weight is up today.  No overt symptoms of fluid volume overload.  Specifically no shortness of breath, dyspnea on exertion, orthopnea or lower extremity edema.  Advised patient to monitor his weight  more closely at home and if weight gain or to continue, would like to assess patient for full fluid volume overload, consider changing Demadex.  Blood pressure improved as patient rested in room.  Advised him to take Entresto, Toprol prior to doctor's appointments so we can assess his blood pressure while he is on his medications.  He verbalized understanding.        Other   Anxiety    Chronic, stable.  Continue Paxil 20 mg      Mixed hyperlipidemia    Chronic, stable.  LDL less than 100.  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  He understands to return to prior urgent care where he has had physical done in the past  I am having [employer] maintain his aspirin EC, PARoxetine, tadalafil, rosuvastatin, allopurinol, metoprolol succinate, torsemide, and Entresto.   No orders of the defined types were placed in this encounter.   Return precautions given.   Risks, benefits, and alternatives of the medications and treatment plan prescribed today were discussed, and patient expressed understanding.   Education regarding symptom management and diagnosis given to patient on AVS.  Continue to follow with Allegra Grana, FNP for routine health maintenance.   [employer] and I agreed with plan.   Rennie Plowman, FNP

## 2021-11-08 ENCOUNTER — Other Ambulatory Visit: Payer: Self-pay

## 2021-11-08 ENCOUNTER — Ambulatory Visit (INDEPENDENT_AMBULATORY_CARE_PROVIDER_SITE_OTHER): Payer: Medicare HMO | Admitting: Family

## 2021-11-08 ENCOUNTER — Encounter: Payer: Self-pay | Admitting: Family

## 2021-11-08 VITALS — BP 132/62 | HR 57 | Temp 97.9°F | Ht 75.95 in | Wt 338.4 lb

## 2021-11-08 DIAGNOSIS — Z125 Encounter for screening for malignant neoplasm of prostate: Secondary | ICD-10-CM

## 2021-11-08 DIAGNOSIS — I25118 Atherosclerotic heart disease of native coronary artery with other forms of angina pectoris: Secondary | ICD-10-CM

## 2021-11-08 DIAGNOSIS — I5020 Unspecified systolic (congestive) heart failure: Secondary | ICD-10-CM

## 2021-11-08 DIAGNOSIS — R7303 Prediabetes: Secondary | ICD-10-CM

## 2021-11-08 DIAGNOSIS — Z8739 Personal history of other diseases of the musculoskeletal system and connective tissue: Secondary | ICD-10-CM

## 2021-11-08 LAB — COMPREHENSIVE METABOLIC PANEL
ALT: 9 U/L (ref 0–53)
AST: 12 U/L (ref 0–37)
Albumin: 4.2 g/dL (ref 3.5–5.2)
Alkaline Phosphatase: 64 U/L (ref 39–117)
BUN: 28 mg/dL — ABNORMAL HIGH (ref 6–23)
CO2: 33 mEq/L — ABNORMAL HIGH (ref 19–32)
Calcium: 9.4 mg/dL (ref 8.4–10.5)
Chloride: 102 mEq/L (ref 96–112)
Creatinine, Ser: 1.35 mg/dL (ref 0.40–1.50)
GFR: 52.66 mL/min — ABNORMAL LOW (ref >60.00)
Glucose, Bld: 101 mg/dL — ABNORMAL HIGH (ref 70–99)
Potassium: 4.8 mEq/L (ref 3.5–5.1)
Sodium: 140 mEq/L (ref 135–145)
Total Bilirubin: 0.6 mg/dL (ref 0.2–1.2)
Total Protein: 6.6 g/dL (ref 6.0–8.3)

## 2021-11-08 LAB — HEMOGLOBIN A1C: Hgb A1c MFr Bld: 6.3 % (ref 4.6–6.5)

## 2021-11-08 LAB — LIPID PANEL
Cholesterol: 151 mg/dL (ref 0–200)
HDL: 33.9 mg/dL — ABNORMAL LOW (ref >39.00)
LDL Cholesterol: 91 mg/dL (ref 0–99)
NonHDL: 117.36
Total CHOL/HDL Ratio: 4
Triglycerides: 134 mg/dL (ref 0.0–149.0)
VLDL: 26.8 mg/dL (ref 0.0–40.0)

## 2021-11-08 LAB — URIC ACID: Uric Acid, Serum: 6.8 mg/dL (ref 4.0–7.8)

## 2021-11-08 LAB — PSA: PSA: 0.97 ng/mL (ref 0.10–4.00)

## 2021-11-08 NOTE — Patient Instructions (Signed)
We should consider metformin based on labs.   This is  Dr. Melina Schools  example of a  "Low GI"  Diet:  It will allow you to lose 4 to 8  lbs  per month if you follow it carefully.  Your goal with exercise is a minimum of 30 minutes of aerobic exercise 5 days per week (Walking does not count once it becomes easy!)    All of the foods can be found at grocery stores and in bulk at [employer].  The Atkins protein bars and shakes are available in more varieties at Target, WalMart and [employer]'s Foods.     7 AM Breakfast:  Choose from the following:  Low carbohydrate Protein  Shakes (I recommend the  Premier Protein chocolate shakes,  EAS AdvantEdge "Carb Control" shakes  Or the Atkins shakes all are under 3 net carbs)     a scrambled egg/bacon/cheese burrito made with Mission's "carb balance" whole wheat tortilla  (about 10 net carbs )  Medical laboratory scientific officer (basically a quiche without the pastry crust) that is eaten cold and very convenient way to get your eggs.  8 carbs)  If you make your own protein shakes, avoid bananas and pineapple,  And use low carb greek yogurt or original /unsweetened almond or soy milk    Avoid cereal and bananas, oatmeal and cream of wheat and grits. They are loaded with carbohydrates!   10 AM: high protein snack:  Protein bar by Atkins (the snack size, under 200 cal, usually < 6 net carbs).    A stick of cheese:  Around 1 carb,  100 cal     Dannon Light n Fit [place] Yogurt  (80 cal, 8 carbs)  Other so called "protein bars" and Greek yogurts tend to be loaded with carbohydrates.  Remember, in food advertising, the word "energy" is synonymous for " carbohydrate."  Lunch:   A Sandwich using the bread choices listed, Can use any  Eggs,  lunchmeat, grilled meat or canned tuna), avocado, regular mayo/mustard  and cheese.  A Salad using blue cheese, ranch,  Goddess or vinagrette,  Avoid taco shells, croutons or "confetti" and no "candied nuts" but regular nuts OK.    No pretzels, nabs  or chips.  Pickles and miniature sweet peppers are a good low carb alternative that provide a "crunch"  The bread is the only source of carbohydrate in a sandwich and  can be decreased by trying some of the attached alternatives to traditional loaf bread   Avoid "Low fat dressings, as well as Reyne Dumas and [employer] dressings They are loaded with sugar!   3 PM/ Mid day  Snack:  Consider  1 ounce of  almonds, walnuts, pistachios, pecans, peanuts,  Macadamia nuts or a nut medley.  Avoid "granola and granola bars "  Mixed nuts are ok in moderation as long as there are no raisins,  cranberries or dried fruit.   KIND bars are OK if you get the low glycemic index variety   Try the prosciutto/mozzarella cheese sticks by Fiorruci  In deli /backery section   High protein      6 PM  Dinner:     Meat/fowl/fish with a green salad, and either broccoli, cauliflower, green beans, spinach, brussel sprouts or  Lima beans. DO NOT BREAD THE PROTEIN!!      There is a low carb pasta by Dreamfield's that is acceptable and tastes great: only 5 digestible carbs/serving.( All grocery stores but  BJs carry it ) Several ready made meals are available low carb:   Try Michel Angelo's chicken piccata or chicken or eggplant parm over low carb pasta.(Lowes and BJs)   Clifton Custard Sanchez's "Carnitas" (pulled pork, no sauce,  0 carbs) or his beef pot roast to make a dinner burrito (at BJ's)  Pesto over low carb pasta (bj's sells a good quality pesto in the center refrigerated section of the deli   Try satueeing  Roosvelt Harps with mushroooms as a good side   Green Giant makes a mashed cauliflower that tastes like mashed potatoes  Whole wheat pasta is still full of digestible carbs and  Not as low in glycemic index as Dreamfield's.   Brown rice is still rice,  So skip the rice and noodles if you eat [place] or [place] (or at least limit to 1/2 cup)  9 PM snack :   Breyer's "low carb" fudgsicle or  ice cream bar  (Carb Smart line), or  Weight Watcher's ice cream bar , or another "no sugar added" ice cream;  a serving of fresh berries/cherries with whipped cream   Cheese or DANNON'S LlGHT N FIT GREEK YOGURT  8 ounces of [place] unsweetened almond/cococunut milk    Treat yourself to a parfait made with whipped cream blueberiies, walnuts and vanilla greek yogurt  Avoid bananas, pineapple, grapes  and watermelon on a regular basis because they are high in sugar.  THINK OF THEM AS DESSERT  Remember that snack Substitutions should be less than 10 NET carbs per serving and meals < 20 carbs. Remember to subtract fiber grams to get the "net carbs."

## 2021-11-08 NOTE — Assessment & Plan Note (Signed)
Chronic stable. Continue crestor 20mg . Updating lipid panel today.

## 2021-11-08 NOTE — Assessment & Plan Note (Signed)
Pending a1c. Discussed metformin for aid in weight loss. Would consider SGLT2 in setting of HFrEF however wouldn't anticipate weight loss. Counseled on low glycemic diet. Will follow.

## 2021-11-08 NOTE — Progress Notes (Signed)
Subjective:    Patient ID: Samuel Gallegos, male    DOB: 02/17/1951, 71 y.o.   MRN: [id number]  CC: Samuel Gallegos is a 71 y.o. male who presents today for follow up.   HPI: He has lost 2 lbs since last visit. Feels well today. He is eating less food intentionally. He intends to go back to the gym. 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  He drinks sweet tea.  No orthopnea, sob, leg swelling, fatigue.   Denies trouble urinating, hesitancy.   H/p HFrEF- No appointment with Dr Mariah Milling scheduled.  He is compliant with metoprolol, Entresto, torsemide.   H/o gout- compliant with allopurinol and recurrent of left toe suspected gout flare. He questions whether he needs.    Anxiety- compliant with paxil 20mg   HLD- compliant with crestor 5mg   HISTORY:  History reviewed. No pertinent past medical history. Past Surgical History:  Procedure Laterality Date   HERNIA REPAIR  1953   Family History  Problem Relation Age of Onset   Cancer Mother    Hypertension Father     Allergies: Amoxicillin Current Outpatient Medications on File Prior to Visit  Medication Sig Dispense Refill   allopurinol (ZYLOPRIM) 100 MG tablet TAKE 1 TABLET BY MOUTH EVERY DAY 90 tablet 1   aspirin EC 81 MG tablet Take 1 tablet (81 mg total) by mouth daily. Swallow whole. 90 tablet 3   metoprolol succinate (TOPROL-XL) 25 MG 24 hr tablet Take 0.5 tablets (12.5 mg total) by mouth daily. 45 tablet 3   PARoxetine (PAXIL) 20 MG tablet TAKE 1 TABLET BY MOUTH EVERY DAY 90 tablet 3   rosuvastatin (CRESTOR) 5 MG tablet TAKE 1 TABLET BY MOUTH EVERY DAY 90 tablet 3   sacubitril-valsartan (ENTRESTO) 97-103 MG Take 1 tablet by mouth 2 (two) times daily. 60 tablet 11   tadalafil (CIALIS) 20 MG tablet Take 1 tablet (20 mg total) by mouth daily as needed for erectile dysfunction. 30 tablet 1   torsemide (DEMADEX) 20 MG tablet Take 1 tablet (20 mg total) by mouth daily. 90 tablet 3   No current facility-administered  medications on file prior to visit.    Social History   Tobacco Use   Smoking status: Never   Smokeless tobacco: Never  Vaping Use   Vaping Use: Never used  Substance Use Topics   Alcohol use: Never   Drug use: Never    Review of Systems  Constitutional:  Negative for chills and fever.  Respiratory:  Negative for cough.   Cardiovascular:  Negative for chest pain and palpitations.  Gastrointestinal:  Negative for nausea and vomiting.     Objective:    BP 132/62 (BP Location: Left Arm, Patient Position: Sitting, Cuff Size: Large)    Pulse (!) 57    Temp 97.9 F (36.6 C) (Oral)    Ht 6' 3.95" (1.929 m)    Wt (!) 338 lb 6.4 oz (153.5 kg)    SpO2 97%    BMI 41.25 kg/m  BP Readings from Last 3 Encounters:  11/08/21 132/62  08/04/21 (!) 150/62  07/11/21 138/60   Wt Readings from Last 3 Encounters:  11/08/21 (!) 338 lb 6.4 oz (153.5 kg)  08/04/21 (!) 340 lb 8 oz (154.4 kg)  07/11/21 (!) 325 lb 6 oz (147.6 kg)    Physical Exam Vitals reviewed.  Constitutional:      Appearance: He is well-developed.  Cardiovascular:     Rate and Rhythm: Regular rhythm.  Heart sounds: Normal heart sounds.  Pulmonary:     Effort: Pulmonary effort is normal. No respiratory distress.     Breath sounds: Normal breath sounds. No wheezing, rhonchi or rales.  Musculoskeletal:     Right lower leg: No edema.     Left lower leg: No edema.  Skin:    General: Skin is warm and dry.  Neurological:     Mental Status: He is alert.  Psychiatric:        Speech: Speech normal.        Behavior: Behavior normal.       Assessment & Plan:   Problem List Items Addressed This Visit       Cardiovascular and Mediastinum   CAD (coronary artery disease)    Chronic stable. Continue crestor 20mg . Updating lipid panel today.       Relevant Orders   Lipid panel   HFrEF (heart failure with reduced ejection fraction) (HCC) - Primary    No evidence fluid volume on exam today. Weight loss of 2 lbs.  Continue metoprolol, Entresto, torsemide.        Relevant Orders   Comprehensive metabolic panel     Other   History of gout    No recurrent flares. Continue allopurinol 100mg  qd and we may consider trial discontinuation as patient expressed interest.       Relevant Orders   Uric acid   Prediabetes    Pending a1c. Discussed metformin for aid in weight loss. Would consider SGLT2 in setting of HFrEF however wouldn't anticipate weight loss. Counseled on low glycemic diet. Will follow.       Relevant Orders   Hemoglobin A1c   Other Visit Diagnoses     Screening for prostate cancer       Relevant Orders   PSA        I am having Samuel Gallegos maintain his aspirin EC, PARoxetine, tadalafil, rosuvastatin, allopurinol, metoprolol succinate, torsemide, and Entresto.   No orders of the defined types were placed in this encounter.   Return precautions given.   Risks, benefits, and alternatives of the medications and treatment plan prescribed today were discussed, and patient expressed understanding.   Education regarding symptom management and diagnosis given to patient on AVS.  Continue to follow with , FNP for routine health maintenance.   and I agreed with plan.   Samuel Grana, FNP

## 2021-11-08 NOTE — Assessment & Plan Note (Signed)
No recurrent flares. Continue allopurinol 100mg  qd and we may consider trial discontinuation as patient expressed interest.

## 2021-11-08 NOTE — Assessment & Plan Note (Signed)
No evidence fluid volume on exam today. Weight loss of 2 lbs. Continue metoprolol, Entresto, torsemide.

## 2021-11-13 ENCOUNTER — Other Ambulatory Visit: Payer: Self-pay

## 2021-11-13 DIAGNOSIS — R7303 Prediabetes: Secondary | ICD-10-CM

## 2021-11-13 MED ORDER — METFORMIN HCL ER 500 MG PO TB24: 500.0000 mg | ORAL_TABLET | Freq: Every evening | ORAL | 3 refills | Status: DC

## 2022-01-05 ENCOUNTER — Other Ambulatory Visit: Payer: Self-pay | Admitting: Family

## 2022-01-07 ENCOUNTER — Other Ambulatory Visit: Payer: Self-pay | Admitting: Family

## 2022-01-07 DIAGNOSIS — F419 Anxiety disorder, unspecified: Secondary | ICD-10-CM

## 2022-01-07 DIAGNOSIS — E785 Hyperlipidemia, unspecified: Secondary | ICD-10-CM

## 2022-02-09 ENCOUNTER — Other Ambulatory Visit: Payer: Self-pay | Admitting: Family

## 2022-02-09 DIAGNOSIS — R7303 Prediabetes: Secondary | ICD-10-CM

## 2022-03-09 ENCOUNTER — Encounter: Payer: Self-pay | Admitting: Family

## 2022-03-09 ENCOUNTER — Ambulatory Visit (INDEPENDENT_AMBULATORY_CARE_PROVIDER_SITE_OTHER): Payer: Medicare HMO | Admitting: Family

## 2022-03-09 ENCOUNTER — Other Ambulatory Visit
Admission: RE | Admit: 2022-03-09 | Discharge: 2022-03-09 | Disposition: A | Discharge status: Home / Self Care | Payer: Medicare HMO | Source: Ambulatory Visit | Attending: Family | Admitting: Family

## 2022-03-09 DIAGNOSIS — R7303 Prediabetes: Secondary | ICD-10-CM | POA: Diagnosis not present

## 2022-03-09 DIAGNOSIS — F419 Anxiety disorder, unspecified: Secondary | ICD-10-CM | POA: Diagnosis not present

## 2022-03-09 DIAGNOSIS — I25118 Atherosclerotic heart disease of native coronary artery with other forms of angina pectoris: Secondary | ICD-10-CM | POA: Insufficient documentation

## 2022-03-09 DIAGNOSIS — I5020 Unspecified systolic (congestive) heart failure: Secondary | ICD-10-CM | POA: Insufficient documentation

## 2022-03-09 DIAGNOSIS — R69 Illness, unspecified: Secondary | ICD-10-CM | POA: Diagnosis not present

## 2022-03-09 LAB — COMPREHENSIVE METABOLIC PANEL
ALT: 13 U/L (ref 0–44)
AST: 16 U/L (ref 15–41)
Albumin: 4.1 g/dL (ref 3.5–5.0)
Alkaline Phosphatase: 57 U/L (ref 38–126)
Anion gap: 8 (ref 5–15)
BUN: 26 mg/dL — ABNORMAL HIGH (ref 8–23)
CO2: 29 mmol/L (ref 22–32)
Calcium: 9.2 mg/dL (ref 8.9–10.3)
Chloride: 102 mmol/L (ref 98–111)
Creatinine, Ser: 1.3 mg/dL — ABNORMAL HIGH (ref 0.61–1.24)
GFR, Estimated: 58 mL/min — ABNORMAL LOW (ref >60)
Glucose, Bld: 112 mg/dL — ABNORMAL HIGH (ref 70–99)
Potassium: 5 mmol/L (ref 3.5–5.1)
Sodium: 139 mmol/L (ref 135–145)
Total Bilirubin: 0.8 mg/dL (ref 0.3–1.2)
Total Protein: 7.2 g/dL (ref 6.5–8.1)

## 2022-03-09 LAB — HEMOGLOBIN A1C
Hgb A1c MFr Bld: 6.1 % — ABNORMAL HIGH (ref 4.8–5.6)
Mean Plasma Glucose: 128.37 mg/dL

## 2022-03-09 NOTE — Assessment & Plan Note (Signed)
Pending A1c.  Patient opted not to take metformin.

## 2022-03-09 NOTE — Progress Notes (Signed)
Subjective:    Patient ID: Samuel Gallegos, male    DOB: 07-27-1951, 70 y.o.   MRN: [id number]  CC: [name] is a 71 y.o. male who presents today for follow up.   HPI: Feels well today.  No new complaints.  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  Denies leg swelling, shortness of breath or chest pain  Prediabetes - He is not taking metformin 500mg  CAD-compliant with Crestor 20 mg GAD-compliant with Paxil 20 mg.  His anxiety is overall well controlled.  HFrEF- compliant with metoprolol, Entresto, torsemide.   HISTORY:  No past medical history on file. Past Surgical History:  Procedure Laterality Date   HERNIA REPAIR  1953   Family History  Problem Relation Age of Onset   Cancer Mother    Hypertension Father     Allergies: Amoxicillin Current Outpatient Medications on File Prior to Visit  Medication Sig Dispense Refill   allopurinol (ZYLOPRIM) 100 MG tablet TAKE 1 TABLET BY MOUTH EVERY DAY 90 tablet 1   aspirin EC 81 MG tablet Take 1 tablet (81 mg total) by mouth daily. Swallow whole. 90 tablet 3   metoprolol succinate (TOPROL-XL) 25 MG 24 hr tablet Take 0.5 tablets (12.5 mg total) by mouth daily. 45 tablet 3   PARoxetine (PAXIL) 20 MG tablet TAKE 1 TABLET BY MOUTH EVERY DAY 90 tablet 3   rosuvastatin (CRESTOR) 5 MG tablet TAKE 1 TABLET BY MOUTH EVERY DAY 90 tablet 3   sacubitril-valsartan (ENTRESTO) 97-103 MG Take 1 tablet by mouth 2 (two) times daily. 60 tablet 11   tadalafil (CIALIS) 20 MG tablet Take 1 tablet (20 mg total) by mouth daily as needed for erectile dysfunction. 30 tablet 1   torsemide (DEMADEX) 20 MG tablet Take 1 tablet (20 mg total) by mouth daily. 90 tablet 3   No current facility-administered medications on file prior to visit.    Social History   Tobacco Use   Smoking status: Never   Smokeless tobacco: Never  Vaping Use   Vaping Use: Never used  Substance Use Topics   Alcohol use: Never   Drug use: Never    Review of  Systems  Constitutional:  Negative for chills and fever.  Respiratory:  Negative for cough and shortness of breath.   Cardiovascular:  Negative for chest pain and palpitations.  Gastrointestinal:  Negative for nausea and vomiting.     Objective:    BP 112/62   Pulse (!) 54   Temp 98.4 F (36.9 C) (Oral)   Ht 6' 6.95" (2.005 m)   Wt (!) 339 lb 3.2 oz (153.9 kg)   SpO2 95%   BMI 38.26 kg/m  BP Readings from Last 3 Encounters:  03/09/22 112/62  11/08/21 132/62  08/04/21 (!) 150/62   Wt Readings from Last 3 Encounters:  03/09/22 (!) 339 lb 3.2 oz (153.9 kg)  11/08/21 (!) 338 lb 6.4 oz (153.5 kg)  08/04/21 (!) 340 lb 8 oz (154.4 kg)    Physical Exam Vitals reviewed.  Constitutional:      Appearance: He is well-developed.  Cardiovascular:     Rate and Rhythm: Regular rhythm.     Heart sounds: Normal heart sounds.  Pulmonary:     Effort: Pulmonary effort is normal. No respiratory distress.     Breath sounds: Normal breath sounds. No wheezing, rhonchi or rales.  Musculoskeletal:     Right lower leg: No edema.     Left lower leg: No edema.  Skin:  General: Skin is warm and dry.  Neurological:     Mental Status: He is alert.  Psychiatric:        Speech: Speech normal.        Behavior: Behavior normal.       Assessment & Plan:   Problem List Items Addressed This Visit       Cardiovascular and Mediastinum   CAD (coronary artery disease)    Lab Results  Component Value Date   LDLCALC 91 11/08/2021  Well-controlled.  Continue Crestor 20 mg      HFrEF (heart failure with reduced ejection fraction) (HCC)    Chronic, symptomatically stable.  Appears euvolemic today.  Continue metoprolol, Entresto, torsemide as managed by cardiology         Other   Anxiety    Chronic, stable.  Continue Paxil 20mg        Prediabetes    Pending A1c.  Patient opted not to take metformin.         I have discontinued Tonio Pierotti's metFORMIN. I am also having him  maintain his aspirin EC, tadalafil, metoprolol succinate, torsemide, Entresto, allopurinol, PARoxetine, and rosuvastatin.   No orders of the defined types were placed in this encounter.   Return precautions given.   Risks, benefits, and alternatives of the medications and treatment plan prescribed today were discussed, and patient expressed understanding.   Education regarding symptom management and diagnosis given to patient on AVS.  Continue to follow with , FNP for routine health maintenance.   Allegra Grana and I agreed with plan.   [employer], FNP

## 2022-03-09 NOTE — Assessment & Plan Note (Signed)
Chronic, symptomatically stable.  Appears euvolemic today.  Continue metoprolol, Entresto, torsemide as managed by cardiology

## 2022-03-09 NOTE — Assessment & Plan Note (Signed)
Lab Results  Component Value Date   LDLCALC 91 11/08/2021   Well-controlled.  Continue Crestor 20 mg

## 2022-03-09 NOTE — Addendum Note (Signed)
Addended by: Marijo Sanes on: 03/09/2022 08:28 AM   Modules accepted: Orders

## 2022-03-09 NOTE — Assessment & Plan Note (Signed)
Chronic, stable.  Continue Paxil 20 mg 

## 2022-07-04 ENCOUNTER — Telehealth: Payer: Medicare HMO | Admitting: Family

## 2022-07-04 ENCOUNTER — Encounter: Payer: Self-pay | Admitting: Family

## 2022-07-04 ENCOUNTER — Ambulatory Visit (INDEPENDENT_AMBULATORY_CARE_PROVIDER_SITE_OTHER): Payer: Medicare HMO | Admitting: Family

## 2022-07-04 VITALS — Ht 78.95 in | Wt 339.0 lb

## 2022-07-04 DIAGNOSIS — R0982 Postnasal drip: Secondary | ICD-10-CM | POA: Insufficient documentation

## 2022-07-04 DIAGNOSIS — R0981 Nasal congestion: Secondary | ICD-10-CM | POA: Insufficient documentation

## 2022-07-04 MED ORDER — LORATADINE 10 MG PO TABS: 10.0000 mg | ORAL_TABLET | Freq: Every day | ORAL | 1 refills | Status: DC

## 2022-07-04 NOTE — Progress Notes (Signed)
Verbal consent for services obtained from patient prior to services given to TELEPHONE visit:   Location of call:  provider at work patient at home  Names of all persons present for services: Mable Paris, NP and patient  Complains of PND, clear congestion x 7 days , waxes and wanes.   He feels 'sick on this stomach in the morning with drainage' with gagging from congestion.   Endorses scant nasal congestion  No facial pain, ear pain, cough, fever, abdominal pain, vomiting, sob, wheezing, cp, leg swelling  He had flu shot 7 days ago.   He has been taking mucinex without relief.   H/o CAD,  HFrEF    A/P/next steps:  Problem List Items Addressed This Visit       Other   Nasal congestion - Primary   Relevant Medications   loratadine (CLARITIN) 10 MG tablet   PND (post-nasal drip)    Duration 7 days.  Afebrile.  Over the phone, no symptoms to suggest fluid volume overload.  Advised to start Mucinex at this time as secretions are thin and clear.  He will start antihistamine.  He will let me know how he is doing.  He politely declines testing for COVID or flu which I think is appropriate as he is outside of the treatment window.        I spent 15 min  discussing plan of care over the phone.

## 2022-07-04 NOTE — Assessment & Plan Note (Signed)
Duration 7 days.  Afebrile.  Over the phone, no symptoms to suggest fluid volume overload.  Advised to start Mucinex at this time as secretions are thin and clear.  He will start antihistamine.  He will let me know how he is doing.  He politely declines testing for COVID or flu which I think is appropriate as he is outside of the treatment window.

## 2022-07-04 NOTE — Patient Instructions (Signed)
Please start Claritin for postnasal drip , suspected virus or allergies.  Please let me know if symptoms do not resolve

## 2022-07-06 ENCOUNTER — Telehealth: Payer: Self-pay

## 2022-07-06 DIAGNOSIS — R0981 Nasal congestion: Secondary | ICD-10-CM

## 2022-07-06 MED ORDER — DOXYCYCLINE HYCLATE 100 MG PO TABS: 100.0000 mg | ORAL_TABLET | Freq: Two times a day (BID) | ORAL | 0 refills | Status: DC

## 2022-07-06 NOTE — Telephone Encounter (Signed)
Patient states Mable Paris, FNP, told him to call back and request an antibiotic if he wasn't better.  Patient states he is not better and would like an antibiotic.  *Patient's preferred pharmacy is [facility] [address] (not in [facility])

## 2022-07-06 NOTE — Telephone Encounter (Signed)
Santiago Glad as discussed  Call pt  [facility] in [facility] which [facility]  Ensure to take probiotics while on antibiotics and also for 2 weeks after completion. This can either be by eating yogurt daily or taking a probiotic supplement over the counter such as Culturelle.It is important to re-colonize the gut with good bacteria and also to prevent any diarrheal infections associated with antibiotic use.   Please ask him to make an in person visit with me if he is not better with the antibiotic.

## 2022-07-11 ENCOUNTER — Telehealth: Payer: Self-pay | Admitting: Family

## 2022-07-11 NOTE — Telephone Encounter (Signed)
Copied from [place] [id number]. Topic: Medicare AWV >> Jul 11, 2022 11:52 AM Devoria Glassing wrote: Reason for CRM: Left message for patient to schedule Annual Wellness Visit.  Please schedule with Nurse Health Advisor Denisa O'Brien-Blaney, LPN at [facility]. This appt can be telephone or office visit.  Please call [phone] ask for [facility]

## 2022-07-23 ENCOUNTER — Other Ambulatory Visit: Payer: Self-pay | Admitting: Cardiovascular Disease

## 2022-07-23 NOTE — Telephone Encounter (Signed)
Rx refill sent to pharmacy. 

## 2022-07-25 ENCOUNTER — Ambulatory Visit (INDEPENDENT_AMBULATORY_CARE_PROVIDER_SITE_OTHER): Payer: Medicare HMO

## 2022-07-25 VITALS — Ht 78.95 in | Wt 339.0 lb

## 2022-07-25 DIAGNOSIS — Z0000 Encounter for general adult medical examination without abnormal findings: Secondary | ICD-10-CM | POA: Diagnosis not present

## 2022-07-25 NOTE — Patient Instructions (Addendum)
Samuel Gallegos , Thank you for taking time to come for your Medicare Wellness Visit. I appreciate your ongoing commitment to your health goals. Please review the following plan we discussed and let me know if I can assist you in the future.   These are the goals we discussed:  Goals       Patient Stated     Weight (lb) < 260 lb (117.9 kg) (pt-stated)      Eat smaller portions. Stay active and hydrated.        This is a list of the screening recommended for you and due dates:  Health Maintenance  Topic Date Due   COVID-19 Vaccine (4 - Pfizer risk series) 08/10/2022*   Tetanus Vaccine  07/26/2029   Pneumonia Vaccine  Completed   Flu Shot  Completed   Hepatitis C Screening: USPSTF Recommendation to screen - Ages 18-79 yo.  Completed   Zoster (Shingles) Vaccine  Completed   HPV Vaccine  Aged Out   Colon Cancer Screening  Discontinued  *Topic was postponed. The date shown is not the original due date.    Advanced directives: declined.   Conditions/risks identified: none new.  Next appointment: Follow up in one year for your annual wellness visit.   Preventive Care 15 Years and Older, Male  Preventive care refers to lifestyle choices and visits with your health care provider that can promote health and wellness. What does preventive care include? A yearly physical exam. This is also called an annual well check. Dental exams once or twice a year. Routine eye exams. Ask your health care provider how often you should have your eyes checked. Personal lifestyle choices, including: Daily care of your teeth and gums. Regular physical activity. Eating a healthy diet. Avoiding tobacco and drug use. Limiting alcohol use. Practicing safe sex. Taking low doses of aspirin every day. Taking vitamin and mineral supplements as recommended by your health care provider. What happens during an annual well check? The services and screenings done by your health care provider during your annual  well check will depend on your age, overall health, lifestyle risk factors, and family history of disease. Counseling  Your health care provider may ask you questions about your: Alcohol use. Tobacco use. Drug use. Emotional well-being. Home and relationship well-being. Sexual activity. Eating habits. History of falls. Memory and ability to understand (cognition). Work and work Statistician. Screening  You may have the following tests or measurements: Height, weight, and BMI. Blood pressure. Lipid and cholesterol levels. These may be checked every 5 years, or more frequently if you are over 25 years old. Skin check. Lung cancer screening. You may have this screening every year starting at age 71 if you have a 30-pack-year history of smoking and currently smoke or have quit within the past 15 years. Fecal occult blood test (FOBT) of the stool. You may have this test every year starting at age 67. Flexible sigmoidoscopy or colonoscopy. You may have a sigmoidoscopy every 5 years or a colonoscopy every 10 years starting at age 44. Prostate cancer screening. Recommendations will vary depending on your family history and other risks. Hepatitis C blood test. Hepatitis B blood test. Sexually transmitted disease (STD) testing. Diabetes screening. This is done by checking your blood sugar (glucose) after you have not eaten for a while (fasting). You may have this done every 1-3 years. Abdominal aortic aneurysm (AAA) screening. You may need this if you are a current or former smoker. Osteoporosis. You may be screened  starting at age 78 if you are at high risk. Talk with your health care provider about your test results, treatment options, and if necessary, the need for more tests. Vaccines  Your health care provider may recommend certain vaccines, such as: Influenza vaccine. This is recommended every year. Tetanus, diphtheria, and acellular pertussis (Tdap, Td) vaccine. You may need a Td booster  every 10 years. Zoster vaccine. You may need this after age 69. Pneumococcal 13-valent conjugate (PCV13) vaccine. One dose is recommended after age 59. Pneumococcal polysaccharide (PPSV23) vaccine. One dose is recommended after age 56. Talk to your health care provider about which screenings and vaccines you need and how often you need them. This information is not intended to replace advice given to you by your health care provider. Make sure you discuss any questions you have with your health care provider. Document Released: 10/28/2015 Document Revised: 06/20/2016 Document Reviewed: 08/02/2015 Elsevier Interactive Patient Education  2017 [place] Prevention in the Home Falls can cause injuries. They can happen to people of all ages. There are many things you can do to make your home safe and to help prevent falls. What can I do on the outside of my home? Regularly fix the edges of walkways and driveways and fix any cracks. Remove anything that might make you trip as you walk through a door, such as a raised step or threshold. Trim any bushes or trees on the path to your home. Use bright outdoor lighting. Clear any walking paths of anything that might make someone trip, such as rocks or tools. Regularly check to see if handrails are loose or broken. Make sure that both sides of any steps have handrails. Any raised decks and porches should have guardrails on the edges. Have any leaves, snow, or ice cleared regularly. Use sand or salt on walking paths during winter. Clean up any spills in your garage right away. This includes oil or grease spills. What can I do in the bathroom? Use night lights. Install grab bars by the toilet and in the tub and shower. Do not use towel bars as grab bars. Use non-skid mats or decals in the tub or shower. If you need to sit down in the shower, use a plastic, non-slip stool. Keep the floor dry. Clean up any water that spills on the floor as soon  as it happens. Remove soap buildup in the tub or shower regularly. Attach bath mats securely with double-sided non-slip rug tape. Do not have throw rugs and other things on the floor that can make you trip. What can I do in the bedroom? Use night lights. Make sure that you have a light by your bed that is easy to reach. Do not use any sheets or blankets that are too big for your bed. They should not hang down onto the floor. Have a firm chair that has side arms. You can use this for support while you get dressed. Do not have throw rugs and other things on the floor that can make you trip. What can I do in the kitchen? Clean up any spills right away. Avoid walking on wet floors. Keep items that you use a lot in easy-to-reach places. If you need to reach something above you, use a strong step stool that has a grab bar. Keep electrical cords out of the way. Do not use floor polish or wax that makes floors slippery. If you must use wax, use non-skid floor wax. Do not have throw  rugs and other things on the floor that can make you trip. What can I do with my stairs? Do not leave any items on the stairs. Make sure that there are handrails on both sides of the stairs and use them. Fix handrails that are broken or loose. Make sure that handrails are as long as the stairways. Check any carpeting to make sure that it is firmly attached to the stairs. Fix any carpet that is loose or worn. Avoid having throw rugs at the top or bottom of the stairs. If you do have throw rugs, attach them to the floor with carpet tape. Make sure that you have a light switch at the top of the stairs and the bottom of the stairs. If you do not have them, ask someone to add them for you. What else can I do to help prevent falls? Wear shoes that: Do not have high heels. Have rubber bottoms. Are comfortable and fit you well. Are closed at the toe. Do not wear sandals. If you use a stepladder: Make sure that it is fully  opened. Do not climb a closed stepladder. Make sure that both sides of the stepladder are locked into place. Ask someone to hold it for you, if possible. Clearly mark and make sure that you can see: Any grab bars or handrails. First and last steps. Where the edge of each step is. Use tools that help you move around (mobility aids) if they are needed. These include: Canes. Walkers. Scooters. Crutches. Turn on the lights when you go into a dark area. Replace any light bulbs as soon as they burn out. Set up your furniture so you have a clear path. Avoid moving your furniture around. If any of your floors are uneven, fix them. If there are any pets around you, be aware of where they are. Review your medicines with your doctor. Some medicines can make you feel dizzy. This can increase your chance of falling. Ask your doctor what other things that you can do to help prevent falls. This information is not intended to replace advice given to you by your health care provider. Make sure you discuss any questions you have with your health care provider. Document Released: 07/28/2009 Document Revised: 03/08/2016 Document Reviewed: 11/05/2014 Elsevier Interactive Patient Education  2017 [employer].

## 2022-07-25 NOTE — Progress Notes (Signed)
Subjective:   Samuel Gallegos is a 71 y.o. male who presents for Medicare Annual/Subsequent preventive examination.  Review of Systems    No ROS.  Medicare Wellness Virtual Visit.  Visual/audio telehealth visit, UTA vital signs.   See social history for additional risk factors.   Cardiac Risk Factors include: advanced age (>66men, >67 women);male gender     Objective:    Today's Vitals   07/25/22 1037  Weight: (!) 339 lb (153.8 kg)  Height: 6' 6.95" (2.005 m)   Body mass index is 38.24 kg/m.     07/25/2022   10:52 AM 01/13/2021   10:00 PM 01/13/2021   11:03 AM 07/07/2020    1:09 PM 06/24/2019   10:22 AM  Advanced Directives  Does Patient Have a Medical Advance Directive? No  No No No  Would patient like information on creating a medical advance directive? No - Patient declined No - Patient declined  No - Patient declined No - Patient declined    Current Medications (verified) Outpatient Encounter Medications as of 07/25/2022  Medication Sig   allopurinol (ZYLOPRIM) 100 MG tablet TAKE 1 TABLET BY MOUTH EVERY DAY   aspirin EC 81 MG tablet Take 1 tablet (81 mg total) by mouth daily. Swallow whole.   doxycycline (VIBRA-TABS) 100 MG tablet Take 1 tablet (100 mg total) by mouth 2 (two) times daily.   loratadine (CLARITIN) 10 MG tablet Take 1 tablet (10 mg total) by mouth daily.   metoprolol succinate (TOPROL-XL) 25 MG 24 hr tablet TAKE 1/2 TABLET BY MOUTH EVERY DAY   PARoxetine (PAXIL) 20 MG tablet TAKE 1 TABLET BY MOUTH EVERY DAY   rosuvastatin (CRESTOR) 5 MG tablet TAKE 1 TABLET BY MOUTH EVERY DAY   sacubitril-valsartan (ENTRESTO) 97-103 MG Take 1 tablet by mouth 2 (two) times daily.   tadalafil (CIALIS) 20 MG tablet Take 1 tablet (20 mg total) by mouth daily as needed for erectile dysfunction.   torsemide (DEMADEX) 20 MG tablet TAKE 1 TABLET BY MOUTH EVERY DAY   No facility-administered encounter medications on file as of 07/25/2022.    Allergies (verified) Amoxicillin    History: History reviewed. No pertinent past medical history. Past Surgical History:  Procedure Laterality Date   HERNIA REPAIR  1953   Family History  Problem Relation Age of Onset   Cancer Mother    Hypertension Father    Social History   Socioeconomic History   Marital status: Married    Spouse name: Not on file   Number of children: Not on file   Years of education: Not on file   Highest education level: Not on file  Occupational History   Not on file  Tobacco Use   Smoking status: Never   Smokeless tobacco: Never  Vaping Use   Vaping Use: Never used  Substance and Sexual Activity   Alcohol use: Never   Drug use: Never   Sexual activity: Not on file  Other Topics Concern   Not on file  Social History Narrative   [name] , drives truck as well   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   Married   Social Determinants of Health   Financial Resource Strain: Low Risk  (07/25/2022)   Overall Financial Resource Strain (CARDIA)    Difficulty of Paying Living Expenses: Not hard at all  Food Insecurity: No Food Insecurity (07/25/2022)   Hunger Vital Sign    Worried About Running Out of Food in the Last Year: Never true    Ran Out  of Food in the Last Year: Never true  Transportation Needs: No Transportation Needs (07/25/2022)   PRAPARE - Administrator, Civil Service (Medical): No    Lack of Transportation (Non-Medical): No  Physical Activity: Not on file  Stress: No Stress Concern Present (07/25/2022)   [employer] of Occupational Health - Occupational Stress Questionnaire    Feeling of Stress : Not at all  Social Connections: Unknown (07/25/2022)   Social Connection and Isolation Panel [NHANES]    Frequency of Communication with Friends and Family: Not on file    Frequency of Social Gatherings with Friends and Family: Not on file    Attends Religious Services: Not on file    Active Member of Clubs or Organizations: Not on file    Attends Tax inspector Meetings: Not on file    Marital Status: Married    Tobacco Counseling Counseling given: Not Answered   Clinical Intake:  Pre-visit preparation completed: Yes        Diabetes: No  How often do you need to have someone help you when you read instructions, pamphlets, or other written materials from your doctor or pharmacy?: 2 - Rarely    Interpreter Needed?: No      Activities of Daily Living    07/25/2022   10:53 AM  In your present state of health, do you have any difficulty performing the following activities:  Hearing? 0  Vision? 0  Difficulty concentrating or making decisions? 0  Walking or climbing stairs? 0  Dressing or bathing? 0  Doing errands, shopping? 0  Preparing Food and eating ? N  Using the Toilet? N  In the past six months, have you accidently leaked urine? N  Do you have problems with loss of bowel control? N  Managing your Medications? Y  Comment Wife manages  Managing your Finances? Y  Comment Wife manages  Housekeeping or managing your Housekeeping? N    Patient Care Team: Allegra Grana, FNP as PCP - General (Family Medicine) Antonieta Iba, MD as PCP - Cardiology (Cardiology)  Indicate any recent Medical Services you may have received from other than [facility] providers in the past year (date may be approximate).     Assessment:   This is a routine wellness examination for Samuel Gallegos.  I connected with  Samuel Gallegos on 07/25/22 by a audio enabled telemedicine application and verified that I am speaking with the correct person using two identifiers.  Patient Location: Home  Provider Location: Office/Clinic  I discussed the limitations of evaluation and management by telemedicine. The patient expressed understanding and agreed to proceed.   Hearing/Vision screen Hearing Screening - Comments:: Patient is able to hear conversational tones without difficulty. No issues reported. Vision Screening - Comments:: Wears  corrective lenses. Cataract extraction, bilateral. They have seen their ophthalmologist in the last 12 months.  Dietary issues and exercise activities discussed: Current Exercise Habits: Home exercise routine Regular diet   Goals Addressed               This Visit's Progress     Patient Stated     Weight (lb) < 260 lb (117.9 kg) (pt-stated)   339 lb (153.8 kg)     Eat smaller portions. Stay active and hydrated.       Depression Screen    07/25/2022   10:42 AM 08/04/2021    8:04 AM 07/10/2021    1:51 PM 07/07/2020    1:06 PM 06/03/2020  1:43 PM 06/24/2019   10:22 AM 04/15/2019    8:05 AM  PHQ 2/9 Scores  PHQ - 2 Score 0 0 0 0 0 0 0  PHQ- 9 Score       0    Fall Risk    07/25/2022   10:53 AM 08/04/2021    8:04 AM 07/10/2021    1:54 PM 07/07/2020    1:09 PM 06/03/2020    1:43 PM  [place] in the past year? 0 0 0 0 0  Number falls in past yr: 0 0  0 0  Injury with Fall? 0 0   0  Risk for fall due to : No Fall Risks      Follow up Falls evaluation completed Falls evaluation completed Falls evaluation completed Falls evaluation completed Falls evaluation completed    [place]: Home free of loose throw rugs in walkways, pet beds, electrical cords, etc? Yes  Adequate lighting in your home to reduce risk of falls? Yes   ASSISTIVE DEVICES UTILIZED TO PREVENT FALLS: Life alert? No  Use of a cane, walker or w/c? No  Grab bars in the bathroom? No  Shower chair or bench in shower? No  Elevated toilet seat or a handicapped toilet? No   TIMED UP AND GO: Was the test performed? No .    Cognitive Function: Patient is alert.  Unable to complete 6CIT. Declined.        06/24/2019   10:28 AM  6CIT Screen  What Year? 0 points  What month? 0 points  What time? 0 points  Count back from 20 0 points  Months in reverse 0 points  Repeat phrase 0 points  Total Score 0 points    Immunizations Immunization History   Administered Date(s) Administered   Influenza Inj Mdck Quad Pf 07/16/2018   Influenza, High Dose Seasonal PF 07/27/2019, 07/17/2021, 06/27/2022   Influenza-Unspecified 07/04/2020, 06/15/2021   PFIZER(Purple Top)SARS-COV-2 Vaccination 11/26/2019, 12/23/2019, 07/21/2020   Pneumococcal Conjugate-13 08/06/2015   Pneumococcal Polysaccharide-23 07/18/2016   Tdap 07/27/2019   Zoster Recombinat (Shingrix) 07/17/2021, 10/19/2021   Zoster, Live 08/01/2013   Covid-19 vaccine status: Completed vaccines x3.   Screening Tests Health Maintenance  Topic Date Due   COVID-19 Vaccine (4 - Pfizer risk series) 08/10/2022 (Originally 09/15/2020)   TETANUS/TDAP  07/26/2029   Pneumonia Vaccine 71+ Years old  Completed   INFLUENZA VACCINE  Completed   Hepatitis C Screening  Completed   Zoster Vaccines- Shingrix  Completed   HPV VACCINES  Aged Out   COLONOSCOPY (Pts 45-26yrs Insurance coverage will need to be confirmed)  Discontinued   Health Maintenance There are no preventive care reminders to display for this patient.  Lung Cancer Screening: (Low Dose CT Chest recommended if Age 31-80 years, 30 pack-year currently smoking OR have quit w/in 15years.) does not qualify.   Hepatitis C Screening: Completed 2020  Vision Screening: Recommended annual ophthalmology exams for early detection of glaucoma and other disorders of the eye.  Dental Screening: Recommended annual dental exams for proper oral hygiene  Community Resource Referral / Chronic Care Management: CRR required this visit?  No   CCM required this visit?  No      Plan:     I have personally reviewed and noted the following in the patient's chart:   Medical and social history Use of alcohol, tobacco or illicit drugs  Current medications and supplements including opioid prescriptions. Patient is not  currently taking opioid prescriptions. Functional ability and status Nutritional status Physical activity Advanced directives List of  other physicians Hospitalizations, surgeries, and ER visits in previous 12 months Vitals Screenings to include cognitive, depression, and falls Referrals and appointments  In addition, I have reviewed and discussed with patient certain preventive protocols, quality metrics, and best practice recommendations. A written personalized care plan for preventive services as well as general preventive health recommendations were provided to patient.     Ashok Pall, LPN   [id number]

## 2022-07-26 ENCOUNTER — Other Ambulatory Visit: Payer: Self-pay | Admitting: Family

## 2022-07-26 ENCOUNTER — Other Ambulatory Visit: Payer: Self-pay | Admitting: Cardiovascular Disease

## 2022-07-26 DIAGNOSIS — R0981 Nasal congestion: Secondary | ICD-10-CM

## 2022-08-03 ENCOUNTER — Other Ambulatory Visit: Payer: Self-pay | Admitting: Family

## 2022-08-09 NOTE — Telephone Encounter (Signed)
Patient's wife went to pick up allopurinol (ZYLOPRIM) 100 MG tablet and it was not at pharmacy.

## 2022-08-10 NOTE — Telephone Encounter (Signed)
I refilled allopurinol Let pt know

## 2022-08-27 NOTE — Progress Notes (Signed)
Cardiology Office Note  Date:  08/28/2022   ID:  [name], DOB 10/25/1950, MRN [id number]  PCP:  Allegra Grana, FNP   Chief Complaint  Patient presents with   12 month follow up     "Doing well." Medications reviewed by the patient verbally.     HPI:  Samuel Gallegos is a 71 y.o. 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  Good energy, sleeps well Feels well in general Weight down 339 to 322, "I am eating different" More veggies No leg swelling, denies PND orthopnea, no chest pain concerning for angina  Reports blood pressure well controlled at home  Echocardiogram March 17, 2021 Ejection fraction improved up to 45 to 50%  EKG personally reviewed by myself on todays visit Sinus bradycardia rate 59 bpm nonspecific T wave abnormality lateral leads Left anterior fascicular block  Other past medical history reviewed Cardiac CTA September 2021 Coronary calcium score 393, Nonobstructive disease, proximal LAD and RCA  Echocardiogram April 01, 2020,   1. Left ventricular ejection fraction, by estimation, is 20 to 25%. The  left ventricle has severely decreased function. The left ventricle  demonstrates global hypokinesis. The left ventricular internal cavity size  was severely dilated. Left ventricular  diastolic parameters are consistent with Grade II diastolic dysfunction  (pseudonormalization).   2. Right ventricular systolic function is normal. The right ventricular  size is normal. There is moderately elevated pulmonary artery systolic  pressure. The estimated right ventricular systolic pressure is 47.9 mmHg.   3. Left atrial size was moderately dilated.    4. Mild to moderate mitral valve regurgitation.   5.  Aortic valve regurgitation is  mild to moderate.   Cardiac CTA,  1. Minimal to mild non-obstructive CAD,   Atherosclerosis primarily of the proximal LAD and RCA arteries. 2. Coronary calcium score of 393. 3. Normal coronary origin with right dominance. 6. Findings consistent with non-ischemic cardiomyopathy   PMH:   has no past medical history on file.  PSH:    Past Surgical History:  Procedure Laterality Date   HERNIA REPAIR  1953    Current Outpatient Medications  Medication Sig Dispense Refill   allopurinol (ZYLOPRIM) 100 MG tablet TAKE 1 TABLET BY MOUTH EVERY DAY 90 tablet 3   aspirin EC 81 MG tablet Take 1 tablet (81 mg total) by mouth daily. Swallow whole. 90 tablet 3   ENTRESTO 97-103 MG TAKE 1 TABLET BY MOUTH TWICE A DAY 60 tablet 0   loratadine (CLARITIN) 10 MG tablet TAKE 1 TABLET BY MOUTH EVERY DAY 90 tablet 1   metoprolol succinate (TOPROL-XL) 25 MG 24 hr tablet TAKE 1/2 TABLET BY MOUTH EVERY DAY 45 tablet 0   PARoxetine (PAXIL) 20 MG tablet TAKE 1 TABLET BY MOUTH EVERY DAY 90 tablet 3   rosuvastatin (CRESTOR) 5 MG tablet TAKE 1 TABLET BY MOUTH EVERY DAY 90 tablet 3   torsemide (DEMADEX) 20 MG tablet TAKE 1 TABLET BY MOUTH EVERY DAY 90 tablet 0   doxycycline (VIBRA-TABS) 100 MG tablet Take 1 tablet (100 mg total) by mouth 2 (two) times daily. (Patient not taking: Reported on 08/28/2022) 10 tablet 0   tadalafil (  CIALIS) 20 MG tablet Take 1 tablet (20 mg total) by mouth daily as needed for erectile dysfunction. (Patient not taking: Reported on 08/28/2022) 30 tablet 1   No current facility-administered medications for this visit.    Allergies:   Amoxicillin   Social History:  The patient  reports that he has never smoked. He has never used smokeless tobacco. He reports that he does not drink alcohol and does not use drugs.   Family History:   family history includes Cancer in his mother; Hypertension in his  father.    Review of Systems: Review of Systems  Constitutional: Negative.   HENT: Negative.    Respiratory: Negative.    Cardiovascular: Negative.   Gastrointestinal: Negative.   Musculoskeletal: Negative.   Neurological: Negative.   Psychiatric/Behavioral: Negative.    All other systems reviewed and are negative.   PHYSICAL EXAM: VS:  BP (!) 120/58 (BP Location: Left Arm, Patient Position: Sitting, Cuff Size: Normal)   Pulse (!) 59   Ht 6\' 4"  (1.93 m)   Wt (!) 322 lb 6 oz (146.2 kg)   SpO2 96%   BMI 39.24 kg/m  , BMI Body mass index is 39.24 kg/m. Constitutional:  oriented to person, place, and time. No distress.  HENT:  Head: Grossly normal Eyes:  no discharge. No scleral icterus.  Neck: No JVD, no carotid bruits  Cardiovascular: Regular rate and rhythm, no murmurs appreciated Pulmonary/Chest: Clear to auscultation bilaterally, no wheezes or rails Abdominal: Soft.  no distension.  no tenderness.  Musculoskeletal: Normal range of motion Neurological:  normal muscle tone. Coordination normal. No atrophy Skin: Skin warm and dry Psychiatric: normal affect, pleasant  Recent Labs: 03/09/2022: ALT 13; BUN 26; Creatinine, Ser 1.30; Potassium 5.0; Sodium 139    Lipid Panel Lab Results  Component Value Date   CHOL 151 11/08/2021   HDL 33.90 (L) 11/08/2021   LDLCALC 91 11/08/2021   TRIG 134.0 11/08/2021      Wt Readings from Last 3 Encounters:  08/28/22 (!) 322 lb 6 oz (146.2 kg)  07/25/22 (!) 339 lb (153.8 kg)  07/04/22 (!) 339 lb (153.8 kg)     ASSESSMENT AND PLAN:  Problem List Items Addressed This Visit       Cardiology Problems   HFrEF (heart failure with reduced ejection fraction) (HCC)   CAD (coronary artery disease) - Primary   Mixed hyperlipidemia   Mild dilation of ascending aorta (HCC)   Valvular heart disease   Other Visit Diagnoses     Dyspnea on exertion       Cardiomyopathy, unspecified type ([place])          Nonischemic  cardiomyopathy Likely secondary to chronic hypertensive heart disease Has done better with blood pressure control using metoprolol succinate, Entresto  97/103 mg p.o. twice daily,torsemide once daily Spironolactone not started given chronically elevated potassium He does not want Jardiance/Farxiga given cost Medications refilled  Acute on chronic renal dysfunction Creatinine 1.3  Coronary calcification Continue Crestor goal LDL less than 70  Morbid obesity Weight trending downward through dietary changes    Total encounter time more than 30 minutes  Greater than 50% was spent in counseling and coordination of care with the patient    Signed, Esmond Plants, M.D., Ph.D. [place], [place]

## 2022-08-28 ENCOUNTER — Encounter: Payer: Self-pay | Admitting: Cardiovascular Disease

## 2022-08-28 ENCOUNTER — Ambulatory Visit: Payer: Medicare HMO | Attending: Cardiovascular Disease | Admitting: Cardiovascular Disease

## 2022-08-28 VITALS — BP 120/58 | HR 59 | Ht 76.0 in | Wt 322.4 lb

## 2022-08-28 DIAGNOSIS — I38 Endocarditis, valve unspecified: Secondary | ICD-10-CM

## 2022-08-28 DIAGNOSIS — I5020 Unspecified systolic (congestive) heart failure: Secondary | ICD-10-CM | POA: Diagnosis not present

## 2022-08-28 DIAGNOSIS — I77810 Thoracic aortic ectasia: Secondary | ICD-10-CM

## 2022-08-28 DIAGNOSIS — R0609 Other forms of dyspnea: Secondary | ICD-10-CM | POA: Diagnosis not present

## 2022-08-28 DIAGNOSIS — I429 Cardiomyopathy, unspecified: Secondary | ICD-10-CM

## 2022-08-28 DIAGNOSIS — E782 Mixed hyperlipidemia: Secondary | ICD-10-CM | POA: Diagnosis not present

## 2022-08-28 DIAGNOSIS — I25118 Atherosclerotic heart disease of native coronary artery with other forms of angina pectoris: Secondary | ICD-10-CM | POA: Diagnosis not present

## 2022-08-28 NOTE — Patient Instructions (Signed)
Medication Instructions:  No changes  If you need a refill on your cardiac medications before your next appointment, please call your pharmacy.   Lab work: No new labs needed  Testing/Procedures: No new testing needed  Follow-Up: At [facility], you and your health needs are our priority.  As part of our continuing mission to provide you with exceptional heart care, we have created designated Provider Care Teams.  These Care Teams include your primary Cardiologist (physician) and Advanced Practice Providers (APPs -  Physician Assistants and Nurse Practitioners) who all work together to provide you with the care you need, when you need it.  You will need a follow up appointment in 12 months  Providers on your designated Care Team:   Christopher Berge, NP Ryan Dunn, PA-C Cadence Furth, PA-C  COVID-19 Vaccine Information can be found at: [email or web address] For questions related to vaccine distribution or appointments, please email [email or web address] or call [phone].   

## 2022-09-10 ENCOUNTER — Ambulatory Visit (INDEPENDENT_AMBULATORY_CARE_PROVIDER_SITE_OTHER): Payer: Medicare HMO | Admitting: Family

## 2022-09-10 ENCOUNTER — Other Ambulatory Visit: Payer: Self-pay | Admitting: Cardiovascular Disease

## 2022-09-10 ENCOUNTER — Other Ambulatory Visit: Payer: Self-pay | Admitting: Family

## 2022-09-10 DIAGNOSIS — I5020 Unspecified systolic (congestive) heart failure: Secondary | ICD-10-CM

## 2022-09-10 DIAGNOSIS — I25118 Atherosclerotic heart disease of native coronary artery with other forms of angina pectoris: Secondary | ICD-10-CM | POA: Diagnosis not present

## 2022-09-10 DIAGNOSIS — R7303 Prediabetes: Secondary | ICD-10-CM | POA: Diagnosis not present

## 2022-09-10 DIAGNOSIS — R69 Illness, unspecified: Secondary | ICD-10-CM | POA: Diagnosis not present

## 2022-09-10 DIAGNOSIS — N189 Chronic kidney disease, unspecified: Secondary | ICD-10-CM

## 2022-09-10 DIAGNOSIS — R7309 Other abnormal glucose: Secondary | ICD-10-CM | POA: Diagnosis not present

## 2022-09-10 DIAGNOSIS — F419 Anxiety disorder, unspecified: Secondary | ICD-10-CM

## 2022-09-10 LAB — LIPID PANEL
Cholesterol: 142 mg/dL (ref 0–200)
HDL: 31.1 mg/dL — ABNORMAL LOW (ref >39.00)
LDL Cholesterol: 87 mg/dL (ref 0–99)
NonHDL: 110.97
Total CHOL/HDL Ratio: 5
Triglycerides: 120 mg/dL (ref 0.0–149.0)
VLDL: 24 mg/dL (ref 0.0–40.0)

## 2022-09-10 LAB — COMPREHENSIVE METABOLIC PANEL
ALT: 8 U/L (ref 0–53)
AST: 9 U/L (ref 0–37)
Albumin: 4.2 g/dL (ref 3.5–5.2)
Alkaline Phosphatase: 68 U/L (ref 39–117)
BUN: 34 mg/dL — ABNORMAL HIGH (ref 6–23)
CO2: 29 mEq/L (ref 19–32)
Calcium: 9.1 mg/dL (ref 8.4–10.5)
Chloride: 100 mEq/L (ref 96–112)
Creatinine, Ser: 1.69 mg/dL — ABNORMAL HIGH (ref 0.40–1.50)
GFR: 39.98 mL/min — ABNORMAL LOW (ref >60.00)
Glucose, Bld: 99 mg/dL (ref 70–99)
Potassium: 4.9 mEq/L (ref 3.5–5.1)
Sodium: 136 mEq/L (ref 135–145)
Total Bilirubin: 0.5 mg/dL (ref 0.2–1.2)
Total Protein: 6.7 g/dL (ref 6.0–8.3)

## 2022-09-10 LAB — HEMOGLOBIN A1C: Hgb A1c MFr Bld: 6.4 % (ref 4.6–6.5)

## 2022-09-10 NOTE — Progress Notes (Signed)
Subjective:    Patient ID: Samuel Gallegos, male    DOB: [birth year], 71 y.o.   MRN: [id number]  CC: Samuel Gallegos is a 71 y.o. male who presents today for follow up.   HPI: Feels well.  No complaints  Continues to work on the farm.  Denies shortness of breath, leg swelling, orthopnea, chest pain  He has been eating less calories and lost weight  Coronary disease-compliant with Crestor 20 mg  Heart failure with reduced ejection fraction-compliant with metoprolol 12.5mg  QD, Entresto 97-103 BID torsemide 20mg  QD  Anxiety-compliant with Paxil 20 mg.  He feels medication is adequate and anxiety is controlled.   follow-up cardiology, Dr. 08/28/2022 for nonischemic cardiomyopathy, coronary calcification   HISTORY:  No past medical history on file. Past Surgical History:  Procedure Laterality Date   HERNIA REPAIR  1953   Family History  Problem Relation Age of Onset   Cancer Mother    Hypertension Father     Allergies: Amoxicillin Current Outpatient Medications on File Prior to Visit  Medication Sig Dispense Refill   allopurinol (ZYLOPRIM) 100 MG tablet TAKE 1 TABLET BY MOUTH EVERY DAY 90 tablet 3   aspirin EC 81 MG tablet Take 1 tablet (81 mg total) by mouth daily. Swallow whole. 90 tablet 3   ENTRESTO 97-103 MG TAKE 1 TABLET BY MOUTH TWICE A DAY 60 tablet 0   loratadine (CLARITIN) 10 MG tablet TAKE 1 TABLET BY MOUTH EVERY DAY 90 tablet 1   metoprolol succinate (TOPROL-XL) 25 MG 24 hr tablet TAKE 1/2 TABLET BY MOUTH EVERY DAY 45 tablet 0   PARoxetine (PAXIL) 20 MG tablet TAKE 1 TABLET BY MOUTH EVERY DAY 90 tablet 3   rosuvastatin (CRESTOR) 5 MG tablet TAKE 1 TABLET BY MOUTH EVERY DAY 90 tablet 3   tadalafil (CIALIS) 20 MG tablet Take 1 tablet (20 mg total) by mouth daily as needed for erectile dysfunction. (Patient not taking: Reported on 08/28/2022) 30 tablet 1   torsemide (DEMADEX) 20 MG tablet TAKE 1 TABLET BY MOUTH EVERY DAY 90 tablet 0   No current  facility-administered medications on file prior to visit.    Social History   Tobacco Use   Smoking status: Never   Smokeless tobacco: Never  Vaping Use   Vaping Use: Never used  Substance Use Topics   Alcohol use: Never   Drug use: Never    Review of Systems  Constitutional:  Negative for chills and fever.  Respiratory:  Negative for cough.   Cardiovascular:  Negative for chest pain and palpitations.  Gastrointestinal:  Negative for nausea and vomiting.      Objective:    There were no vitals taken for this visit. BP Readings from Last 3 Encounters:  08/28/22 (!) 120/58  03/09/22 112/62  11/08/21 132/62   Wt Readings from Last 3 Encounters:  08/28/22 (!) 322 lb 6 oz (146.2 kg)  07/25/22 (!) 339 lb (153.8 kg)  07/04/22 (!) 339 lb (153.8 kg)    Physical Exam Vitals reviewed.  Constitutional:      Appearance: He is well-developed.  Cardiovascular:     Rate and Rhythm: Regular rhythm.     Heart sounds: Normal heart sounds.  Pulmonary:     Effort: Pulmonary effort is normal. No respiratory distress.     Breath sounds: Normal breath sounds. No wheezing, rhonchi or rales.  Skin:    General: Skin is warm and dry.  Neurological:     Mental Status: He is alert.  Psychiatric:        Speech: Speech normal.        Behavior: Behavior normal.        Assessment & Plan:   Problem List Items Addressed This Visit       Cardiovascular and Mediastinum   CAD (coronary artery disease)   HFrEF (heart failure with reduced ejection fraction) (HCC)    Chronic, symptomatically stable.  Continue metoprolol 12.5mg  QD, Entresto 97-103 BID torsemide 20mg  QD        Other   Anxiety    Chronic, stable.  Continue Paxil 20 mg qd      Prediabetes - Primary    Congratulated patient on weight loss and being more conscientious about calorie consumption.  pending A1C      Relevant Orders   Comprehensive metabolic panel   Lipid panel   Other Visit Diagnoses     Elevated  glucose       Relevant Orders   Hemoglobin A1c        I have discontinued Audrick Motz's doxycycline. I am also having him maintain his aspirin EC, tadalafil, PARoxetine, rosuvastatin, metoprolol succinate, torsemide, loratadine, Entresto, and allopurinol.   No orders of the defined types were placed in this encounter.   Return precautions given.   Risks, benefits, and alternatives of the medications and treatment plan prescribed today were discussed, and patient expressed understanding.   Education regarding symptom management and diagnosis given to patient on AVS.  Continue to follow with Burnard Hawthorne, FNP for routine health maintenance.   [employer] and I agreed with plan.   Mable Paris, FNP

## 2022-09-10 NOTE — Assessment & Plan Note (Signed)
Chronic, symptomatically stable.  Continue metoprolol 12.5mg  QD, Entresto 97-103 BID torsemide 20mg  QD

## 2022-09-10 NOTE — Assessment & Plan Note (Addendum)
Congratulated patient on weight loss and being more conscientious about calorie consumption.  pending A1C

## 2022-09-10 NOTE — Assessment & Plan Note (Signed)
Chronic, stable.  Continue Paxil 20 mg qd

## 2022-10-20 ENCOUNTER — Other Ambulatory Visit: Payer: Self-pay | Admitting: Cardiovascular Disease

## 2022-11-28 ENCOUNTER — Other Ambulatory Visit: Payer: Self-pay | Admitting: Cardiovascular Disease

## 2022-12-23 ENCOUNTER — Other Ambulatory Visit: Payer: Self-pay | Admitting: Cardiovascular Disease

## 2023-03-04 ENCOUNTER — Telehealth: Payer: Self-pay | Admitting: Family

## 2023-03-04 DIAGNOSIS — F419 Anxiety disorder, unspecified: Secondary | ICD-10-CM

## 2023-03-04 DIAGNOSIS — E785 Hyperlipidemia, unspecified: Secondary | ICD-10-CM

## 2023-03-04 MED ORDER — PAROXETINE HCL 20 MG PO TABS
20.0000 mg | ORAL_TABLET | Freq: Every day | ORAL | 3 refills | Status: AC
Start: 2023-03-04 — End: ?

## 2023-03-04 NOTE — Telephone Encounter (Signed)
Prescription Request  03/04/2023  LOV: Visit date not found  What is the name of the medication or equipment? PARoxetine (PAXIL) 20 MG tablet and rosuvastatin (CRESTOR) 5 MG tablet  Have you contacted your pharmacy to request a refill? Yes   Which pharmacy would you like this sent to?   [id number] [address] [place] [address] Phone: [phone] Fax: [phone]     Patient notified that their request is being sent to the clinical staff for review and that they should receive a response within 2 business days.   Please advise at Mobile [phone] (mobile)

## 2023-03-12 ENCOUNTER — Encounter: Payer: Self-pay | Admitting: Family

## 2023-03-12 ENCOUNTER — Ambulatory Visit (INDEPENDENT_AMBULATORY_CARE_PROVIDER_SITE_OTHER): Payer: Medicare HMO | Admitting: Family

## 2023-03-12 VITALS — BP 128/60 | HR 55 | Temp 97.8°F | Ht 76.0 in | Wt 326.5 lb

## 2023-03-12 DIAGNOSIS — R7303 Prediabetes: Secondary | ICD-10-CM

## 2023-03-12 DIAGNOSIS — I25118 Atherosclerotic heart disease of native coronary artery with other forms of angina pectoris: Secondary | ICD-10-CM | POA: Diagnosis not present

## 2023-03-12 DIAGNOSIS — I5020 Unspecified systolic (congestive) heart failure: Secondary | ICD-10-CM

## 2023-03-12 DIAGNOSIS — Z125 Encounter for screening for malignant neoplasm of prostate: Secondary | ICD-10-CM | POA: Diagnosis not present

## 2023-03-12 DIAGNOSIS — Z8739 Personal history of other diseases of the musculoskeletal system and connective tissue: Secondary | ICD-10-CM

## 2023-03-12 DIAGNOSIS — F419 Anxiety disorder, unspecified: Secondary | ICD-10-CM | POA: Diagnosis not present

## 2023-03-12 DIAGNOSIS — E785 Hyperlipidemia, unspecified: Secondary | ICD-10-CM

## 2023-03-12 LAB — COMPREHENSIVE METABOLIC PANEL
ALT: 8 U/L (ref 0–53)
AST: 11 U/L (ref 0–37)
Albumin: 4 g/dL (ref 3.5–5.2)
Alkaline Phosphatase: 61 U/L (ref 39–117)
BUN: 32 mg/dL — ABNORMAL HIGH (ref 6–23)
CO2: 29 mEq/L (ref 19–32)
Calcium: 8.8 mg/dL (ref 8.4–10.5)
Chloride: 102 mEq/L (ref 96–112)
Creatinine, Ser: 1.49 mg/dL (ref 0.40–1.50)
GFR: 46.34 mL/min — ABNORMAL LOW (ref >60.00)
Glucose, Bld: 99 mg/dL (ref 70–99)
Potassium: 5 mEq/L (ref 3.5–5.1)
Sodium: 138 mEq/L (ref 135–145)
Total Bilirubin: 0.5 mg/dL (ref 0.2–1.2)
Total Protein: 6.3 g/dL (ref 6.0–8.3)

## 2023-03-12 LAB — LIPID PANEL
Cholesterol: 132 mg/dL (ref 0–200)
HDL: 35.9 mg/dL — ABNORMAL LOW (ref >39.00)
LDL Cholesterol: 81 mg/dL (ref 0–99)
NonHDL: 95.68
Total CHOL/HDL Ratio: 4
Triglycerides: 73 mg/dL (ref 0.0–149.0)
VLDL: 14.6 mg/dL (ref 0.0–40.0)

## 2023-03-12 LAB — PSA, MEDICARE: PSA: 1.06 ng/ml (ref 0.10–4.00)

## 2023-03-12 LAB — URIC ACID: Uric Acid, Serum: 7.1 mg/dL (ref 4.0–7.8)

## 2023-03-12 LAB — HEMOGLOBIN A1C: Hgb A1c MFr Bld: 6 % (ref 4.6–6.5)

## 2023-03-12 NOTE — Assessment & Plan Note (Signed)
Anticipate stable.  Pending lipid panel   Continue Crestor 5 mg qd.

## 2023-03-12 NOTE — Assessment & Plan Note (Signed)
Chronic, stable.  Continue Paxil 20 mg qd 

## 2023-03-12 NOTE — Progress Notes (Signed)
Assessment & Plan:  Hyperlipidemia, unspecified hyperlipidemia type -     Comprehensive metabolic panel -     Lipid panel -     Hemoglobin A1c  Coronary artery disease of native artery of native heart with stable angina pectoris [facility]) Assessment & Plan: Anticipate stable.  Pending lipid panel   Continue Crestor 5 mg qd.   Orders: -     Comprehensive metabolic panel -     Lipid panel -     Hemoglobin A1c  Screening for prostate cancer -     PSA, Medicare  Prediabetes -     Hemoglobin A1c  History of gout -     Uric acid  Anxiety Assessment & Plan: Chronic, stable.  Continue Paxil 20 mg qd   HFrEF (heart failure with reduced ejection fraction) (HCC) Assessment & Plan: Chronic, euvolemic.Marland Kitchen  Continue metoprolol 12.5mg  qd , Entresto 97-103 mg twice daily as managed by Dr. Mariah Milling, cardiology.      Return precautions given.   Risks, benefits, and alternatives of the medications and treatment plan prescribed today were discussed, and patient expressed understanding.   Education regarding symptom management and diagnosis given to patient on AVS either electronically or printed.  No follow-ups on file.  Rennie Plowman, FNP  Subjective:    Patient ID: Samuel Gallegos, male    DOB: 15-Oct-1951, 72 y.o.   MRN: [id number]  CC: Samuel Gallegos is a 72 y.o. male who presents today for follow up.   HPI: Feels well today.  Medication regimen is working well for him .  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 Swallow whole. 90 tablet 3   loratadine (CLARITIN) 10 MG tablet TAKE 1 TABLET BY  MOUTH EVERY DAY 90 tablet 1   metoprolol succinate (TOPROL-XL) 25 MG 24 hr tablet TAKE 1/2 TABLET BY MOUTH DAILY 45 tablet 1   PARoxetine (PAXIL) 20 MG tablet Take 1 tablet (20 mg total) by mouth daily. 90 tablet 3   rosuvastatin (CRESTOR) 5 MG tablet TAKE 1 TABLET BY MOUTH EVERY DAY 90 tablet 3   sacubitril-valsartan (ENTRESTO) 97-103 MG TAKE 1 TABLET BY MOUTH TWICE A DAY 60 tablet 7   tadalafil (CIALIS) 20 MG tablet Take 1 tablet (20 mg total) by mouth daily as needed for erectile dysfunction. 30 tablet 1   torsemide (DEMADEX) 20 MG tablet TAKE 1 TABLET BY MOUTH EVERY DAY 90 tablet 2   No current facility-administered medications on file prior to visit.    Review of Systems  Constitutional:  Negative for chills, fatigue and fever.  Respiratory:  Negative for cough and shortness of breath.   Cardiovascular:  Negative for chest pain, palpitations and leg swelling.  Gastrointestinal:  Negative for nausea and vomiting.      Objective:    BP 128/60   Pulse (!) 55   Temp 97.8 F (36.6 C) (Temporal)   Ht 6\' 4"  (1.93 m)   Wt (!) 326 lb 8 oz (148.1 kg)   SpO2 98%   BMI 39.74 kg/m  BP Readings from Last 3 Encounters:  03/12/23 128/60  08/28/22 (!) 120/58  03/09/22 112/62   Wt Readings from Last 3 Encounters:  03/12/23 (!) 326 lb 8 oz (148.1 kg)  08/28/22 (!) 322 lb 6 oz (146.2 kg)  07/25/22 (!) 339 lb (153.8 kg)    Physical Exam Vitals reviewed.  Constitutional:      Appearance: He is well-developed.  Cardiovascular:     Rate and Rhythm: Regular rhythm.     Heart sounds: Normal heart sounds.  Pulmonary:     Effort: Pulmonary effort is normal. No respiratory distress.     Breath sounds: Normal breath sounds. No wheezing, rhonchi or rales.  Skin:    General: Skin is warm and dry.  Neurological:     Mental Status: He is alert.  Psychiatric:        Speech: Speech normal.        Behavior: Behavior normal.

## 2023-03-12 NOTE — Assessment & Plan Note (Signed)
Chronic, euvolemic.Marland Kitchen  Continue metoprolol 12.5mg  qd , Entresto 97-103 mg twice daily as managed by Dr. Mariah Milling, cardiology.

## 2023-06-24 DIAGNOSIS — I5020 Unspecified systolic (congestive) heart failure: Secondary | ICD-10-CM | POA: Diagnosis not present

## 2023-06-24 DIAGNOSIS — I2510 Atherosclerotic heart disease of native coronary artery without angina pectoris: Secondary | ICD-10-CM | POA: Diagnosis not present

## 2023-06-24 DIAGNOSIS — F419 Anxiety disorder, unspecified: Secondary | ICD-10-CM | POA: Diagnosis not present

## 2023-06-24 DIAGNOSIS — M109 Gout, unspecified: Secondary | ICD-10-CM | POA: Diagnosis not present

## 2023-06-24 DIAGNOSIS — E782 Mixed hyperlipidemia: Secondary | ICD-10-CM | POA: Diagnosis not present

## 2023-06-24 DIAGNOSIS — I110 Hypertensive heart disease with heart failure: Secondary | ICD-10-CM | POA: Diagnosis not present

## 2023-06-24 DIAGNOSIS — R7303 Prediabetes: Secondary | ICD-10-CM | POA: Diagnosis not present

## 2023-06-24 DIAGNOSIS — N5201 Erectile dysfunction due to arterial insufficiency: Secondary | ICD-10-CM | POA: Diagnosis not present

## 2023-06-24 DIAGNOSIS — I77810 Thoracic aortic ectasia: Secondary | ICD-10-CM | POA: Diagnosis not present

## 2023-06-28 ENCOUNTER — Other Ambulatory Visit: Payer: Self-pay | Admitting: Cardiovascular Disease

## 2023-07-21 ENCOUNTER — Other Ambulatory Visit: Payer: Self-pay | Admitting: Cardiovascular Disease

## 2023-08-03 ENCOUNTER — Other Ambulatory Visit: Payer: Self-pay | Admitting: Cardiovascular Disease

## 2023-08-29 DIAGNOSIS — N1831 Chronic kidney disease, stage 3a: Secondary | ICD-10-CM | POA: Diagnosis not present

## 2023-08-29 DIAGNOSIS — E782 Mixed hyperlipidemia: Secondary | ICD-10-CM | POA: Diagnosis not present

## 2023-08-29 DIAGNOSIS — Z6841 Body Mass Index (BMI) 40.0 and over, adult: Secondary | ICD-10-CM | POA: Diagnosis not present

## 2023-08-29 DIAGNOSIS — Z0000 Encounter for general adult medical examination without abnormal findings: Secondary | ICD-10-CM | POA: Diagnosis not present

## 2023-08-29 DIAGNOSIS — I130 Hypertensive heart and chronic kidney disease with heart failure and stage 1 through stage 4 chronic kidney disease, or unspecified chronic kidney disease: Secondary | ICD-10-CM | POA: Diagnosis not present

## 2023-08-29 DIAGNOSIS — N5201 Erectile dysfunction due to arterial insufficiency: Secondary | ICD-10-CM | POA: Diagnosis not present

## 2023-08-29 DIAGNOSIS — I429 Cardiomyopathy, unspecified: Secondary | ICD-10-CM | POA: Diagnosis not present

## 2023-08-29 DIAGNOSIS — Z8739 Personal history of other diseases of the musculoskeletal system and connective tissue: Secondary | ICD-10-CM | POA: Diagnosis not present

## 2023-08-29 DIAGNOSIS — I5020 Unspecified systolic (congestive) heart failure: Secondary | ICD-10-CM | POA: Diagnosis not present

## 2023-08-29 DIAGNOSIS — Z23 Encounter for immunization: Secondary | ICD-10-CM | POA: Diagnosis not present

## 2023-08-29 DIAGNOSIS — I77810 Thoracic aortic ectasia: Secondary | ICD-10-CM | POA: Diagnosis not present

## 2023-08-29 DIAGNOSIS — I2510 Atherosclerotic heart disease of native coronary artery without angina pectoris: Secondary | ICD-10-CM | POA: Diagnosis not present

## 2023-08-29 DIAGNOSIS — I10 Essential (primary) hypertension: Secondary | ICD-10-CM | POA: Diagnosis not present

## 2023-08-30 ENCOUNTER — Other Ambulatory Visit: Payer: Self-pay | Admitting: Cardiovascular Disease

## 2023-09-01 NOTE — Progress Notes (Unsigned)
Cardiology Office Note  Date:  09/02/2023   ID:  Cathleen Gallegos, DOB [birth year], MRN [id number]  PCP:  Carren Rang, PA-C   Chief Complaint  Patient presents with   12 month follow up     "Doing well." Medications reviewed by the patient verbally.     HPI:  Samuel Gallegos is a 72 y.o. 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, tractors  Denies significant chest pain  No significant leg swelling On ambulation, denies shortness of breath, overall reports he feels well  Labs reviewed: CR 1.8, BUN 39 on torsemide 20 daily Total chol 157, LDL 98 tolerating Crestor 5 daily  Echocardiogram March 17, 2021 Ejection fraction improved up to 45 to 50%  EKG personally reviewed by myself on todays visit EKG Interpretation Date/Time:  Monday September 02 2023 08:08:53 EST Ventricular Rate:  51 PR Interval:  248 QRS Duration:  138 QT Interval:  464 QTC Calculation: 427 R Axis:   -59  Text Interpretation: Sinus bradycardia with sinus arrhythmia with 1st degree A-V block Left axis deviation Non-specific intra-ventricular conduction block Minimal voltage criteria for LVH, may be normal variant ( Cornell product ) When compared with ECG of 08-Aug-2020 08:07, Premature atrial complexes are no longer Present T wave inversion no longer evident in Lateral leads Confirmed by Samuel Gallegos [id number]) on 09/02/2023 8:16:13 AM   Other past medical history reviewed Cardiac CTA September 2021 Coronary calcium score 393, Nonobstructive disease, proximal LAD and RCA  Echocardiogram April 01, 2020,   1. Left  ventricular ejection fraction, by estimation, is 20 to 25%. The  left ventricle has severely decreased function. The left ventricle  demonstrates global hypokinesis. The left ventricular internal cavity size  was severely dilated. Left ventricular  diastolic parameters are consistent with Grade II diastolic dysfunction  (pseudonormalization).   2. Right ventricular systolic function is normal. The right ventricular  size is normal. There is moderately elevated pulmonary artery systolic  pressure. The estimated right ventricular systolic pressure is 47.9 mmHg.   3. Left atrial size was moderately dilated.   4. Mild to moderate mitral valve regurgitation.   5.  Aortic valve regurgitation is  mild to moderate.   Cardiac CTA,  1. Minimal to mild non-obstructive CAD,   Atherosclerosis primarily of the proximal LAD and RCA arteries. 2. Coronary calcium score of 393. 3. Normal coronary origin with right dominance. 6. Findings consistent with non-ischemic cardiomyopathy   PMH:   has no past medical history on file.  PSH:    Past Surgical History:  Procedure Laterality Date   HERNIA REPAIR  1953    Current Outpatient Medications  Medication Sig Dispense Refill   allopurinol (ZYLOPRIM) 100 MG tablet TAKE 1 TABLET BY MOUTH EVERY DAY 90 tablet 3   aspirin EC 81 MG tablet Take 1 tablet (81 mg total) by mouth daily. Swallow whole. 90 tablet 3   ENTRESTO 97-103 MG TAKE 1 TABLET BY MOUTH TWICE A DAY 60 tablet 0   loratadine (CLARITIN) 10 MG tablet TAKE 1 TABLET BY MOUTH EVERY DAY 90 tablet 1   metoprolol succinate (TOPROL-XL) 25  MG 24 hr tablet TAKE 1/2 TBALET (12.5MG ) BY MOUTH ONCE DAILY 45 tablet 0   PARoxetine (PAXIL) 20 MG tablet Take 1 tablet (20 mg total) by mouth daily. 90 tablet 3   rosuvastatin (CRESTOR) 5 MG tablet TAKE 1 TABLET BY MOUTH EVERY DAY 90 tablet 3   tadalafil (CIALIS) 20 MG tablet Take 1 tablet (20 mg total) by mouth daily as needed for erectile dysfunction. 30 tablet 1    torsemide (DEMADEX) 20 MG tablet TAKE 1 TABLET BY MOUTH EVERY DAY 90 tablet 0   No current facility-administered medications for this visit.    Allergies:   Amoxicillin and Amoxicillin-pot clavulanate   Social History:  The patient  reports that he has never smoked. He has never used smokeless tobacco. He reports that he does not drink alcohol and does not use drugs.   Family History:   family history includes Cancer in his mother; Hypertension in his father.    Review of Systems: Review of Systems  Constitutional: Negative.   HENT: Negative.    Respiratory: Negative.    Cardiovascular: Negative.   Gastrointestinal: Negative.   Musculoskeletal: Negative.   Neurological: Negative.   Psychiatric/Behavioral: Negative.    All other systems reviewed and are negative.   PHYSICAL EXAM: VS:  BP (!) 140/52 (BP Location: Left Arm, Patient Position: Sitting, Cuff Size: Normal)   Pulse (!) 51   Ht 6\' 3"  (1.905 m)   Wt (!) 335 lb 6 oz (152.1 kg)   SpO2 98%   BMI 41.92 kg/m  , BMI Body mass index is 41.92 kg/m. Constitutional:  oriented to person, place, and time. No distress.  HENT:  Head: Grossly normal Eyes:  no discharge. No scleral icterus.  Neck: No JVD, no carotid bruits  Cardiovascular: Regular rate and rhythm, no murmurs appreciated Pulmonary/Chest: Clear to auscultation bilaterally, no wheezes or rails Abdominal: Soft.  no distension.  no tenderness.  Musculoskeletal: Normal range of motion Neurological:  normal muscle tone. Coordination normal. No atrophy Skin: Skin warm and dry Psychiatric: normal affect, pleasant  Recent Labs: 03/12/2023: ALT 8; BUN 32; Creatinine, Ser 1.49; Potassium 5.0; Sodium 138    Lipid Panel Lab Results  Component Value Date   CHOL 132 03/12/2023   HDL 35.90 (L) 03/12/2023   LDLCALC 81 03/12/2023   TRIG 73.0 03/12/2023      Wt Readings from Last 3 Encounters:  09/02/23 (!) 335 lb 6 oz (152.1 kg)  03/12/23 (!) 326 lb 8 oz (148.1 kg)   08/28/22 (!) 322 lb 6 oz (146.2 kg)     ASSESSMENT AND PLAN:  Problem List Items Addressed This Visit       Cardiology Problems   HFrEF (heart failure with reduced ejection fraction) (HCC)   Relevant Orders   EKG 12-Lead (Completed)   CAD (coronary artery disease)   Relevant Orders   EKG 12-Lead (Completed)   Mixed hyperlipidemia   Mild dilation of ascending aorta (HCC)   Relevant Orders   EKG 12-Lead (Completed)   Valvular heart disease   Relevant Orders   EKG 12-Lead (Completed)   Other Visit Diagnoses     Dyspnea on exertion       Cardiomyopathy, unspecified type (HCC)       Relevant Orders   EKG 12-Lead (Completed)       Nonischemic cardiomyopathy Likely secondary to chronic hypertensive heart disease Reports blood pressure well-controlled at home, yesterday 113 systolic Recommend he continue current dose of metoprolol succinate, Entresto  97/103  mg p.o. twice daily, Suggest he decrease torsemide down to 20 mg every other day given climbing BUN and creatinine Spironolactone not started given chronically elevated potassium He does not want Jardiance/Farxiga given cost Overall appears euvolemic  Acute on chronic renal dysfunction Creatinine 1.8, recommend he decrease torsemide down to 20 every other day  Coronary calcification Recommend he continue Crestor 5, add Zetia 10 daily Denies anginal symptoms  Morbid obesity We have encouraged continued exercise, careful diet management in an effort to lose weight.  Hyperlipidemia As above recommend he add Zetia to his Crestor 5 daily Goal LDL less than 55      Signed, Dossie Arbour, M.D., Ph.D. [facility] [place], [place] [phone]

## 2023-09-02 ENCOUNTER — Telehealth: Payer: Self-pay

## 2023-09-02 ENCOUNTER — Ambulatory Visit: Payer: Medicare HMO | Attending: Cardiovascular Disease | Admitting: Cardiovascular Disease

## 2023-09-02 ENCOUNTER — Encounter: Payer: Self-pay | Admitting: Cardiovascular Disease

## 2023-09-02 DIAGNOSIS — R0609 Other forms of dyspnea: Secondary | ICD-10-CM

## 2023-09-02 DIAGNOSIS — I429 Cardiomyopathy, unspecified: Secondary | ICD-10-CM | POA: Diagnosis not present

## 2023-09-02 DIAGNOSIS — E782 Mixed hyperlipidemia: Secondary | ICD-10-CM | POA: Diagnosis not present

## 2023-09-02 DIAGNOSIS — I5020 Unspecified systolic (congestive) heart failure: Secondary | ICD-10-CM | POA: Diagnosis not present

## 2023-09-02 DIAGNOSIS — I77810 Thoracic aortic ectasia: Secondary | ICD-10-CM | POA: Diagnosis not present

## 2023-09-02 DIAGNOSIS — I38 Endocarditis, valve unspecified: Secondary | ICD-10-CM | POA: Diagnosis not present

## 2023-09-02 DIAGNOSIS — I25118 Atherosclerotic heart disease of native coronary artery with other forms of angina pectoris: Secondary | ICD-10-CM | POA: Diagnosis not present

## 2023-09-02 MED ORDER — ENTRESTO 97-103 MG PO TABS: 1.0000 | ORAL_TABLET | Freq: Two times a day (BID) | ORAL | 3 refills | Status: DC

## 2023-09-02 MED ORDER — TORSEMIDE 20 MG PO TABS: 20.0000 mg | ORAL_TABLET | ORAL | 3 refills | Status: DC

## 2023-09-02 MED ORDER — EZETIMIBE 10 MG PO TABS: 10.0000 mg | ORAL_TABLET | Freq: Every day | ORAL | 3 refills | Status: DC

## 2023-09-02 MED ORDER — METOPROLOL SUCCINATE ER 25 MG PO TB24: 12.5000 mg | ORAL_TABLET | Freq: Every day | ORAL | 3 refills | Status: DC

## 2023-09-02 NOTE — Telephone Encounter (Signed)
Patient's wife, Takeo Okereke, called to state she would like to cancel her husband's appointment.  Thurston Hole states patient has a new PCP.  Thurston Hole states patient liked Rennie Plowman, FNP, but he didn't like our lab, the pain was too much.

## 2023-09-02 NOTE — Patient Instructions (Addendum)
Medication Instructions:  Please decrease the torsemide 20 mg every other day  Please start zetia 10 mg daily for cholesterol  If you need a refill on your cardiac medications before your next appointment, please call your pharmacy.   Lab work: No new labs needed  Testing/Procedures: No new testing needed  Follow-Up: At [facility], you and your health needs are our priority.  As part of our continuing mission to provide you with exceptional heart care, we have created designated Provider Care Teams.  These Care Teams include your primary Cardiologist (physician) and Advanced Practice Providers (APPs -  Physician Assistants and Nurse Practitioners) who all work together to provide you with the care you need, when you need it.  You will need a follow up appointment in 12 months  Providers on your designated Care Team:   Nicolasa Ducking, NP Eula Listen, PA-C Cadence Fransico Michael, [place]  COVID-19 Vaccine Information can be found at: [email or web address] For questions related to vaccine distribution or appointments, please email [email or web address] or call [phone].

## 2023-09-11 ENCOUNTER — Ambulatory Visit: Payer: Medicare HMO | Admitting: Family

## 2023-10-02 ENCOUNTER — Other Ambulatory Visit: Payer: Self-pay | Admitting: Cardiovascular Disease

## 2023-12-11 DIAGNOSIS — J40 Bronchitis, not specified as acute or chronic: Secondary | ICD-10-CM | POA: Diagnosis not present

## 2024-02-20 ENCOUNTER — Other Ambulatory Visit: Payer: Self-pay | Admitting: Family

## 2024-02-20 DIAGNOSIS — F419 Anxiety disorder, unspecified: Secondary | ICD-10-CM

## 2024-02-20 DIAGNOSIS — E785 Hyperlipidemia, unspecified: Secondary | ICD-10-CM

## 2024-02-20 NOTE — Telephone Encounter (Signed)
Sent refill request to PCP

## 2024-08-16 ENCOUNTER — Other Ambulatory Visit: Payer: Self-pay | Admitting: Cardiovascular Disease

## 2024-08-31 NOTE — Progress Notes (Unsigned)
 Cardiology Office Note  Date:  09/01/2024   ID:  Samuel Gallegos, DOB Jan 27, 1951, MRN [id number]  PCP:  Samuel Houston, PA-C   Chief Complaint  Patient presents with   12 month follow up     Patient denies any cardiac issues or concerns.     HPI:  Samuel Gallegos is a 73 y.o. 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, tractors, tour manager  No significant shortness of breath or chest pain No swelling, no PND orthopnea Recent lab work done through primary care, elevated potassium 5.4  Labs reviewed: CR 1.5,on torsemide  20 daily Potassium 5.4 Total chol 99, LDL 49 tolerating Crestor  5 daily A1c 6.0  Echocardiogram March 17, 2021 Ejection fraction improved up to 45 to 50%  EKG personally reviewed by myself on todays visit EKG Interpretation Date/Time:  Tuesday September 01 2024 08:13:53 EST Ventricular Rate:  51 PR Interval:  246 QRS Duration:  134 QT Interval:  466 QTC Calculation: 429 R Axis:   -57  Text Interpretation: Sinus bradycardia with 1st degree A-V block with Premature atrial complexes Left axis deviation Non-specific intra-ventricular conduction block Minimal voltage criteria for LVH, may be normal variant ( Cornell product ) When compared with ECG of 02-Sep-2023 08:08, Premature atrial complexes are now Present Confirmed by Samuel Gallegos [phone]) on 09/01/2024 8:14:49 AM   Other past medical history reviewed Cardiac CTA September 2021 Coronary calcium  score 393, Nonobstructive disease, proximal LAD and RCA  Echocardiogram April 01, 2020,   1. Left ventricular ejection  fraction, by estimation, is 20 to 25%. The  left ventricle has severely decreased function. The left ventricle  demonstrates global hypokinesis. The left ventricular internal cavity size  was severely dilated. Left ventricular  diastolic parameters are consistent with Grade II diastolic dysfunction  (pseudonormalization).   2. Right ventricular systolic function is normal. The right ventricular  size is normal. There is moderately elevated pulmonary artery systolic  pressure. The estimated right ventricular systolic pressure is 47.9 mmHg.   3. Left atrial size was moderately dilated.   4. Mild to moderate mitral valve regurgitation.   5.  Aortic valve regurgitation is  mild to moderate.   Cardiac CTA,  1. Minimal to mild non-obstructive CAD,   Atherosclerosis primarily of the proximal LAD and RCA arteries. 2. Coronary calcium  score of 393. 3. Normal coronary origin with right dominance. 6. Findings consistent with non-ischemic cardiomyopathy   PMH:   has no past medical history on file.  PSH:    Past Surgical History:  Procedure Laterality Date   HERNIA REPAIR  1953    Current Outpatient Medications  Medication Sig Dispense Refill   aspirin  EC 81 MG tablet Take 1 tablet (81 mg total) by mouth daily. Swallow whole. 90 tablet 3   ezetimibe  (ZETIA ) 10 MG tablet TAKE 1 TABLET BY MOUTH EVERY DAY 90 tablet 0   loratadine  (CLARITIN ) 10 MG tablet TAKE 1 TABLET BY MOUTH EVERY DAY 90 tablet 1   metoprolol  succinate (TOPROL -XL) 25 MG 24 hr tablet TAKE 1/2 TABLET BY MOUTH EVERY DAY 45 tablet 0   PARoxetine  (PAXIL ) 20 MG  tablet Take 1 tablet (20 mg total) by mouth daily. 90 tablet 3   rosuvastatin  (CRESTOR ) 5 MG tablet TAKE 1 TABLET BY MOUTH EVERY DAY 90 tablet 3   sacubitril -valsartan  (ENTRESTO ) 97-103 MG Take 1 tablet by mouth 2 (two) times daily. 180 tablet 3   torsemide  (DEMADEX ) 20 MG tablet TAKE 1 TABLET BY MOUTH EVERY DAY 90 tablet 3   tadalafil  (CIALIS ) 20 MG tablet Take 1 tablet  (20 mg total) by mouth daily as needed for erectile dysfunction. 30 tablet 1   No current facility-administered medications for this visit.    Allergies:   Amoxicillin and Amoxicillin-pot clavulanate   Social History:  The patient  reports that he has never smoked. He has never used smokeless tobacco. He reports that he does not drink alcohol and does not use drugs.   Family History:   family history includes Cancer in his mother; Hypertension in his father.    Review of Systems: Review of Systems  Constitutional: Negative.   HENT: Negative.    Respiratory: Negative.    Cardiovascular: Negative.   Gastrointestinal: Negative.   Musculoskeletal: Negative.   Neurological: Negative.   Psychiatric/Behavioral: Negative.    All other systems reviewed and are negative.   PHYSICAL EXAM: VS:  BP (!) 120/54 (BP Location: Left Arm, Patient Position: Sitting, Cuff Size: Normal)   Pulse (!) 51   Ht 6' 4 (1.93 m)   Wt (!) 325 lb 2 oz (147.5 kg)   SpO2 96%   BMI 39.58 kg/m  , BMI Body mass index is 39.58 kg/m. Constitutional:  oriented to person, place, and time. No distress.  HENT:  Head: Grossly normal Eyes:  no discharge. No scleral icterus.  Neck: No JVD, no carotid bruits  Cardiovascular: Regular rate and rhythm, no murmurs appreciated Pulmonary/Chest: Clear to auscultation bilaterally, no wheezes or rales Abdominal: Soft.  no distension.  no tenderness.  Musculoskeletal: Normal range of motion Neurological:  normal muscle tone. Coordination normal. No atrophy Skin: Skin warm and dry Psychiatric: normal affect, pleasant  Recent Labs: No results found for requested labs within last 365 days.    Lipid Panel Lab Results  Component Value Date   CHOL 132 03/12/2023   HDL 35.90 (L) 03/12/2023   LDLCALC 81 03/12/2023   TRIG 73.0 03/12/2023    Wt Readings from Last 3 Encounters:  09/01/24 (!) 325 lb 2 oz (147.5 kg)  09/02/23 (!) 335 lb 6 oz (152.1 kg)  03/12/23 (!) 326 lb  8 oz (148.1 kg)     ASSESSMENT AND PLAN:  Problem List Items Addressed This Visit       Cardiology Problems   HFrEF (heart failure with reduced ejection fraction) (HCC)   Relevant Orders   EKG 12-Lead (Completed)   CAD (coronary artery disease) - Primary   Relevant Orders   EKG 12-Lead (Completed)   Mixed hyperlipidemia   Mild dilation of ascending aorta   Valvular heart disease   Relevant Orders   EKG 12-Lead (Completed)   Other Visit Diagnoses       Dyspnea on exertion         Cardiomyopathy, unspecified type (HCC)       Relevant Orders   EKG 12-Lead (Completed)      Nonischemic cardiomyopathy Improving ejection fraction on last echocardiogram Feels well, active, appears euvolemic Lab work stable, will repeat BMP to recheck potassium which was running high 5.4 For now until lab work comes back continue current medications torsemide  20 daily Entresto   97/103 twice daily metoprolol  succinate 12.5 daily Previously declined Jardiance/Farxiga given cost  Acute on chronic renal dysfunction Creatinine 1.5, numbers improved on reduced dose torsemide  20 daily  Coronary calcification Currently with no symptoms of angina. No further workup at this time. Continue current medication regimen.  continue Crestor  5,  Zetia  10 daily, numbers at goal  Morbid obesity We have encouraged continued exercise, careful diet management in an effort to lose weight.  Hyperlipidemia Cholesterol is at goal on the current lipid regimen. No changes to the medications were made.   Signed, Velinda Lunger, M.D., Ph.D. [facility] [place], [place] [phone]

## 2024-09-01 ENCOUNTER — Ambulatory Visit: Attending: Cardiovascular Disease | Admitting: Cardiovascular Disease

## 2024-09-01 ENCOUNTER — Other Ambulatory Visit
Admission: RE | Admit: 2024-09-01 | Discharge: 2024-09-01 | Disposition: A | Discharge status: Home / Self Care | Source: Ambulatory Visit | Attending: Cardiovascular Disease | Admitting: Cardiovascular Disease

## 2024-09-01 ENCOUNTER — Encounter: Payer: Self-pay | Admitting: Cardiovascular Disease

## 2024-09-01 VITALS — BP 120/54 | HR 51 | Ht 76.0 in | Wt 325.1 lb

## 2024-09-01 DIAGNOSIS — R0609 Other forms of dyspnea: Secondary | ICD-10-CM

## 2024-09-01 DIAGNOSIS — E875 Hyperkalemia: Secondary | ICD-10-CM

## 2024-09-01 DIAGNOSIS — I25118 Atherosclerotic heart disease of native coronary artery with other forms of angina pectoris: Secondary | ICD-10-CM

## 2024-09-01 DIAGNOSIS — I38 Endocarditis, valve unspecified: Secondary | ICD-10-CM | POA: Diagnosis not present

## 2024-09-01 DIAGNOSIS — E782 Mixed hyperlipidemia: Secondary | ICD-10-CM

## 2024-09-01 DIAGNOSIS — I429 Cardiomyopathy, unspecified: Secondary | ICD-10-CM

## 2024-09-01 DIAGNOSIS — I77810 Thoracic aortic ectasia: Secondary | ICD-10-CM

## 2024-09-01 DIAGNOSIS — I5020 Unspecified systolic (congestive) heart failure: Secondary | ICD-10-CM

## 2024-09-01 LAB — BASIC METABOLIC PANEL WITH GFR
Anion gap: 9 (ref 5–15)
BUN: 32 mg/dL — ABNORMAL HIGH (ref 8–23)
CO2: 25 mmol/L (ref 22–32)
Calcium: 9.1 mg/dL (ref 8.9–10.3)
Chloride: 105 mmol/L (ref 98–111)
Creatinine, Ser: 1.32 mg/dL — ABNORMAL HIGH (ref 0.61–1.24)
GFR, Estimated: 57 mL/min — ABNORMAL LOW (ref >60)
Glucose, Bld: 104 mg/dL — ABNORMAL HIGH (ref 70–99)
Potassium: 5 mmol/L (ref 3.5–5.1)
Sodium: 139 mmol/L (ref 135–145)

## 2024-09-01 MED ORDER — TADALAFIL 20 MG PO TABS: 20.0000 mg | ORAL_TABLET | Freq: Every day | ORAL | 1 refills | Status: AC | PRN

## 2024-09-01 MED ORDER — SACUBITRIL-VALSARTAN 97-103 MG PO TABS: 1.0000 | ORAL_TABLET | Freq: Two times a day (BID) | ORAL | 3 refills | Status: AC

## 2024-09-01 MED ORDER — TORSEMIDE 20 MG PO TABS: 20.0000 mg | ORAL_TABLET | Freq: Every day | ORAL | 3 refills | Status: DC

## 2024-09-01 MED ORDER — EZETIMIBE 10 MG PO TABS: 10.0000 mg | ORAL_TABLET | Freq: Every day | ORAL | 3 refills | Status: AC

## 2024-09-01 MED ORDER — TORSEMIDE 20 MG PO TABS: 20.0000 mg | ORAL_TABLET | ORAL | 3 refills | Status: AC

## 2024-09-01 MED ORDER — METOPROLOL SUCCINATE ER 25 MG PO TB24: 12.5000 mg | ORAL_TABLET | Freq: Every day | ORAL | 3 refills | Status: AC

## 2024-09-01 NOTE — Patient Instructions (Addendum)
 Medication Instructions:   No changes  If you need a refill on your cardiac medications before your next appointment, please call your pharmacy.   Lab work: No new labs needed  Testing/Procedures: Your provider would like for you to have following labs drawn today BMP.    Follow-Up: At [facility], you and your health needs are our priority.  As part of our continuing mission to provide you with exceptional heart care, we have created designated Provider Care Teams.  These Care Teams include your primary Cardiologist (physician) and Advanced Practice Providers (APPs -  Physician Assistants and Nurse Practitioners) who all work together to provide you with the care you need, when you need it.  You will need a follow up appointment in 12 months  Providers on your designated Care Team:   Samuel Meager, NP Samuel Bring, PA-C Samuel Gallegos, [place]  COVID-19 Vaccine Information can be found at: [email or web address] For questions related to vaccine distribution or appointments, please email [email or web address] or call [phone].

## 2024-09-04 ENCOUNTER — Ambulatory Visit: Payer: Self-pay | Admitting: Cardiovascular Disease
